# Patient Record
Sex: Female | Born: 1980 | Race: Black or African American | Hispanic: No | Marital: Married | Smoking: Never smoker
Health system: Southern US, Community
[De-identification: ages and names within clinical notes are randomized; demographics above are authoritative.]

## PROBLEM LIST (undated history)

## (undated) DIAGNOSIS — IMO0001 Reserved for inherently not codable concepts without codable children: Secondary | ICD-10-CM

## (undated) DIAGNOSIS — D696 Thrombocytopenia, unspecified: Secondary | ICD-10-CM

## (undated) HISTORY — PX: OTHER SURGICAL HISTORY: SHX169

## (undated) HISTORY — DX: Thrombocytopenia, unspecified: D69.6

---

## 2004-02-15 ENCOUNTER — Inpatient Hospital Stay (HOSPITAL_COMMUNITY): Admission: AD | Admit: 2004-02-15 | Discharge: 2004-02-15 | Payer: Self-pay | Admitting: Family Medicine

## 2004-02-19 ENCOUNTER — Observation Stay (HOSPITAL_COMMUNITY): Admission: AD | Admit: 2004-02-19 | Discharge: 2004-02-20 | Payer: Self-pay | Admitting: Family Medicine

## 2004-02-27 ENCOUNTER — Inpatient Hospital Stay (HOSPITAL_COMMUNITY): Admission: AD | Admit: 2004-02-27 | Discharge: 2004-02-27 | Payer: Self-pay | Admitting: Obstetrics and Gynecology

## 2004-02-28 ENCOUNTER — Encounter (INDEPENDENT_AMBULATORY_CARE_PROVIDER_SITE_OTHER): Payer: Self-pay | Admitting: Specialist

## 2004-02-28 ENCOUNTER — Inpatient Hospital Stay (HOSPITAL_COMMUNITY): Admission: RE | Admit: 2004-02-28 | Discharge: 2004-03-01 | Payer: Self-pay | Admitting: Obstetrics and Gynecology

## 2004-06-28 ENCOUNTER — Other Ambulatory Visit: Admission: RE | Admit: 2004-06-28 | Discharge: 2004-06-28 | Payer: Self-pay | Admitting: Obstetrics and Gynecology

## 2004-12-25 ENCOUNTER — Ambulatory Visit (HOSPITAL_COMMUNITY): Admission: RE | Admit: 2004-12-25 | Discharge: 2004-12-25 | Payer: Self-pay | Admitting: Obstetrics & Gynecology

## 2005-12-08 ENCOUNTER — Ambulatory Visit: Payer: Self-pay | Admitting: Family Medicine

## 2006-04-16 ENCOUNTER — Ambulatory Visit: Payer: Self-pay | Admitting: Family Medicine

## 2006-04-16 LAB — CONVERTED CEMR LAB
ALT: 37 units/L (ref 0–40)
AST: 33 units/L (ref 0–37)
Albumin: 3.8 g/dL (ref 3.5–5.2)
Alkaline Phosphatase: 79 units/L (ref 39–117)
BUN: 16 mg/dL (ref 6–23)
Basophils Absolute: 0.1 10*3/uL (ref 0.0–0.1)
Basophils Relative: 0.9 % (ref 0.0–1.0)
Bilirubin, Direct: 0.1 mg/dL (ref 0.0–0.3)
CO2: 29 meq/L (ref 19–32)
Calcium: 9.1 mg/dL (ref 8.4–10.5)
Chloride: 104 meq/L (ref 96–112)
Creatinine, Ser: 0.8 mg/dL (ref 0.4–1.2)
Eosinophils Absolute: 0.3 10*3/uL (ref 0.0–0.6)
Eosinophils Relative: 4.2 % (ref 0.0–5.0)
GFR calc Af Amer: 112 mL/min
GFR calc non Af Amer: 93 mL/min
Glucose, Bld: 83 mg/dL (ref 70–99)
HCT: 41.7 % (ref 36.0–46.0)
Hemoglobin: 13.7 g/dL (ref 12.0–15.0)
Lymphocytes Relative: 32 % (ref 12.0–46.0)
MCHC: 32.9 g/dL (ref 30.0–36.0)
MCV: 89.5 fL (ref 78.0–100.0)
Monocytes Absolute: 0.7 10*3/uL (ref 0.2–0.7)
Monocytes Relative: 8.6 % (ref 3.0–11.0)
Neutro Abs: 4.1 10*3/uL (ref 1.4–7.7)
Neutrophils Relative %: 54.3 % (ref 43.0–77.0)
Platelets: 195 10*3/uL (ref 150–400)
Potassium: 3.5 meq/L (ref 3.5–5.1)
RBC: 4.66 M/uL (ref 3.87–5.11)
RDW: 12.7 % (ref 11.5–14.6)
Sodium: 140 meq/L (ref 135–145)
TSH: 1.1 microintl units/mL (ref 0.35–5.50)
Total Bilirubin: 0.5 mg/dL (ref 0.3–1.2)
Total Protein: 7.4 g/dL (ref 6.0–8.3)
WBC: 7.6 10*3/uL (ref 4.5–10.5)

## 2006-04-21 ENCOUNTER — Ambulatory Visit: Payer: Self-pay | Admitting: Family Medicine

## 2006-09-14 ENCOUNTER — Inpatient Hospital Stay (HOSPITAL_COMMUNITY): Admission: AD | Admit: 2006-09-14 | Discharge: 2006-09-14 | Payer: Self-pay | Admitting: Obstetrics and Gynecology

## 2007-01-27 ENCOUNTER — Ambulatory Visit: Payer: Self-pay | Admitting: Family Medicine

## 2007-01-27 DIAGNOSIS — J301 Allergic rhinitis due to pollen: Secondary | ICD-10-CM | POA: Insufficient documentation

## 2007-10-04 ENCOUNTER — Inpatient Hospital Stay (HOSPITAL_COMMUNITY): Admission: AD | Admit: 2007-10-04 | Discharge: 2007-10-06 | Payer: Self-pay | Admitting: Obstetrics and Gynecology

## 2007-10-15 ENCOUNTER — Ambulatory Visit: Payer: Self-pay | Admitting: Vascular Surgery

## 2007-10-15 ENCOUNTER — Encounter (INDEPENDENT_AMBULATORY_CARE_PROVIDER_SITE_OTHER): Payer: Self-pay | Admitting: Obstetrics and Gynecology

## 2007-10-15 ENCOUNTER — Ambulatory Visit (HOSPITAL_COMMUNITY): Admission: RE | Admit: 2007-10-15 | Discharge: 2007-10-15 | Payer: Self-pay | Admitting: Obstetrics and Gynecology

## 2008-12-06 ENCOUNTER — Ambulatory Visit: Payer: Self-pay | Admitting: Family Medicine

## 2008-12-06 DIAGNOSIS — J45909 Unspecified asthma, uncomplicated: Secondary | ICD-10-CM | POA: Insufficient documentation

## 2008-12-06 DIAGNOSIS — J309 Allergic rhinitis, unspecified: Secondary | ICD-10-CM | POA: Insufficient documentation

## 2010-01-10 ENCOUNTER — Telehealth: Payer: Self-pay | Admitting: Family Medicine

## 2010-03-26 NOTE — Progress Notes (Signed)
Summary: Pt req antibiotic for abcess in tooth  Phone Note Call from Patient Call back at Home Phone [phone]   Caller: Patient Summary of Call: Pt has an abcess behind a tooth, that goes back to her throat. Pt is req an antibiotic. Pls call in to [facility] on [address]  Initial call taken by: Lucy Antigua,  January 10, 2010 8:21 AM  Follow-up for Phone Call        Fleet Contras please call............ one abscessed tooth.  She needs to see a dentist or if you would like [place] to see her.  We be happy to see her today, but I can call in medicine over the phone Follow-up by: Roderick Pee MD,  January 10, 2010 8:43 AM  Additional Follow-up for Phone Call Additional follow up Details #1::        left message on machine for patient  Additional Follow-up by: Kern Reap CMA Duncan Dull),  January 10, 2010 9:50 AM

## 2010-04-05 ENCOUNTER — Encounter: Payer: Self-pay | Admitting: Family Medicine

## 2010-04-05 ENCOUNTER — Ambulatory Visit (INDEPENDENT_AMBULATORY_CARE_PROVIDER_SITE_OTHER): Payer: Self-pay | Admitting: Family Medicine

## 2010-04-05 DIAGNOSIS — Z049 Encounter for examination and observation for unspecified reason: Secondary | ICD-10-CM

## 2010-04-08 NOTE — Progress Notes (Signed)
  Subjective:    Patient ID: Jasmine [name], female    DOB: 09/19/1980, 30 y.o.   MRN: [id number]  HPI    Review of Systems     Objective:   Physical Exam        Assessment & Plan:  No show

## 2010-07-09 NOTE — H&P (Signed)
Jasmine [name], BENTON                ACCOUNT NO.:  [id number]   MEDICAL RECORD NO.:  [id number]          PATIENT TYPE:  INP   LOCATION:  9115                          [facility]   PHYSICIAN:  Lenoard Aden, M.D.DATE OF BIRTH:  April 03, 1980   DATE OF ADMISSION:  10/04/2007  DATE OF DISCHARGE:                              HISTORY & PHYSICAL   CHIEF COMPLAINT:  Spontaneous rupture of membranes.   She is a 61 African American female at 37 plus weeks with spontaneous  rupture of membranes at approximately 4 a.m. this morning.  She has no  known drug allergies.  Medications are prenatal vitamins.  She has a  history of fibroids uncomplicated during this pregnancy.  She is a  nonsmoker, nondrinker.  Denies domestic or physical violence.  History  of one previous ectopic pregnancy with salpingectomy and history of one  therapeutic abortion, family history of hypertension, stroke, heart  disease, and breast cancer.   PHYSICAL EXAMINATION:  GENERAL:  She is a well-developed, well-nourished  [place] American female in no acute distress.  HEENT:  Normal.  LUNGS:  Clear.  HEART:  Regular rhythm.  ABDOMEN:  Soft, gravid, nontender.  Estimated fetal weight 79 pounds.  Cervix is 2-3, 100%, vertex -1.  EXTREMITIES:  No cords.  NEUROLOGIC:  Nonfocal.  SKIN:  Intact.   IMPRESSION:  Term intrauterine pregnancy with spontaneous rupture of  membranes.   PLAN:  Pitocin augmentation, epidural p.r.n., anticipated attempts at  vaginal delivery.      Lenoard Aden, M.D.  Electronically Signed     RJT/MEDQ  D:  10/04/2007  T:  10/05/2007  Job:  [id number]

## 2010-07-12 NOTE — H&P (Signed)
NAMESHARNAE, WINFREE              ACCOUNT NO.:  [id number]   MEDICAL RECORD NO.:  [id number]          PATIENT TYPE:  AMB   LOCATION:  SDC                           [facility]   PHYSICIAN:  James A. Ashley Royalty, M.D.DATE OF BIRTH:  08/12/1980   DATE OF ADMISSION:  02/28/2004  DATE OF DISCHARGE:                                HISTORY & PHYSICAL   Please have this dictation available on the outpatient surgical area at  [facility], February 28, 2004, at 12:45 p.m.   This is a 30 year old primigravida, who first presented on December 22 as an  unassigned patient, complaining of pelvic pain, vaginal spotting, and  positive pregnancy test noted at [facility].  She was evaluated by the  physician accepting unassigned patients on December 22, and the impression  at that time was a possible early pregnancy with threatened abortion versus  ectopic pregnancy.  Initial hCG was 391 MIU/ml.  Ultrasound was obtained  December 22 and revealed no obvious intrauterine pregnancy.  There were  three masses posterior to the uterus noted.  The etiology of the masses was  somewhat unclear.  Two masses were felt to possibly represent complex cysts.  Another potentially was felt to represent a possible ectopic pregnancy,  though no definitive diagnosis could be made.  At the time of that  encounter, the patient's symptoms were minimal and considering the  uncertainty regarding her last normal menstrual period, the decision was  made to follow her expectantly.  She returned for follow up, I believe, on  December 24, at which time she stated her pain was minimal.  She continued  to deny any postural symptoms.  Repeat hCG was obtained, and the value had  dropped to 317 MIU/mL.   I saw the patient in the office follow up on or about February 23, 2004.  She continued to suggest improving clinical status with almost no pain being  evident.  She had been previously given a prescription for Darvocet which  she had not required at all.  Instead, she was using some ibuprofen for  minimal discomfort.  On December 30, hCG was ordered, and the value was 164  MIU/mL.   The patient returned February 27, 2004, for follow up.  She stated that since  February 25, 2004, her pelvic pain has increased and is beginning to be  debilitating.  She continues to deny any postural symptoms.  She does  continue to have vaginal spotting, but it is less than that of a normal  menstrual period.  She denies any passage of tissue.   MEDICATIONS:  Ibuprofen.   PAST MEDICAL HISTORY:   MEDICAL:  Negative.   SURGICAL:  Negative.   ALLERGIES:  None.   FAMILY HISTORY:  Noncontributory.   SOCIAL HISTORY:  The patient denies use of tobacco or significant alcohol.   REVIEW OF SYSTEMS:  Noncontributory.   PHYSICAL EXAMINATION:  GENERAL:  Well-developed, well-nourished, pleasant,  black female in no acute distress.  VITAL SIGNS:  Afebrile, vital signs stable.  SKIN:  Warm and dry without lesions.  LYMPH NODES:  No supraclavicular,  cervical, or inguinal adenopathy.  HEENT:  Normocephalic.  NECK:  Without thyromegaly.  CHEST/LUNGS:  Clear.  CARDIAC:  Regular rate and rhythm without murmurs, gallops, or rubs.  BREAST EXAM:  Deferred.  ABDOMEN:  Soft and minimally tender to direct palpation in the lower  quadrants.  I can appreciate no rebound tenderness.  I can appreciate no  obvious mass or organomegaly.  Bowel sounds are active.  MUSCULOSKELETAL:  No CVA tenderness.  PELVIC:  External genitalia within normal limits.  Vagina and cervix are  without gross lesions.  There is a minimal amount of old blood in the vault.  Bimanual examination reveals the uterus to be approximately 8-9 x 5 x 4 cm,  and no adnexal masses are palpable.  There is some cervical motion  tenderness present.   Ultrasound today at [facility] revealed no significant change from the  previous ultrasound of on or about February 15, 2004.    IMPRESSION:  1.  Pregnancy-related event - possible spontaneous abortion versus ectopic      versus tubal abortion.  2.  Retrouterine cysts/masses on ultrasound.  Two seemed to represent      visualizing cysts, and one could potentially represent an ectopic.  3.  Increasing abdominal discomfort for the first time since presentation,      February 15, 2004.   PLAN:  Due to the increasing pelvic discomfort, we will proceed with  diagnostic/operative laparoscopy.  Risks, benefits, complications, and  alternatives were fully discussed with the patient.  She understands the  possibility of linear salpingostomy and agrees to same.  She also  understands the distinct possibility of the need for a unilateral salpingo-  oophorectomy and agrees to same.  She also agrees to any indicated  contralateral cystectomies should ovarian cysts be encountered.  Finally,  she understands the possibility of exploratory laparotomy and agrees to  same.  Questions invited and answered.      JAM/MEDQ  D:  02/27/2004  T:  02/27/2004  Job:  [id number]

## 2010-07-12 NOTE — Consult Note (Signed)
[name], [name]              ACCOUNT NO.:  [id number]   MEDICAL RECORD NO.:  [id number]          PATIENT TYPE:  MAT   LOCATION:  MATC                          [facility]   PHYSICIAN:  Charles A. Delcambre, MDDATE OF BIRTH:  08-18-1980   DATE OF CONSULTATION:  02/15/2004  DATE OF DISCHARGE:                                   CONSULTATION   CHIEF COMPLAINT:  Pelvic pain, vaginal spotting, positive pregnancy test at  [facility].   A 30 year old para 0-0-0-0, LMP first week of December, January 29, 2004,  unclear as to estimated gestational age with some pills missed the first one  or two weeks of November, putting her unclear as to estimated gestational  age.  She did some urine pregnancy tests at the end of November and had a  period that was a week early, but her pills were not clearly taken at that  time.  Her period was a week early on January 29, 2004.  She thereafter took  her pills regularly but now started spotting several days ago and developed  cramping pain one to two days ago.  She took some Motrin yesterday and had  pain resolved and then slept last night.  The pain recurred this afternoon,  cramping-like and central in the pelvis, and she presented this afternoon.  She had no severe pain or abdominal pain, just the cramping-like pain and  spotting.   PAST MEDICAL HISTORY:  None.   PAST SURGICAL HISTORY:  None.   MEDICATIONS:  None.   ALLERGIES:  No known drug allergies.   SOCIAL HISTORY:  No tobacco, ethanol, or drug use.  She stopped taking birth  control pills two days ago.  Occasional alcohol use.  Not currently married.  She is sexually active.  Condoms are not used.   FAMILY HISTORY:  Unrelated.   PHYSICAL EXAMINATION:  VITAL SIGNS:  Temperature 97.2, pulse 69,  respirations 20, blood pressure 115/69.  HEENT:  Grossly within normal limits.  NECK:  Supple without thyromegaly or adenopathy.  CHEST:  Lungs clear bilaterally.  BREASTS:  Symmetrical  and otherwise deferred.  CARDIAC:  Regular rate and rhythm.  ABDOMEN:  Soft upper quadrants, nondistended and nontender.  Lower  quadrants:  Very minimal tenderness to deep palpation.  No guarding,  rebound, or peritoneal signs.  Flanks nontender.  PELVIC:  No cervical motion tenderness.  The posterior cul-de-sac is  moderately tender.  Palpating the posterior cul-de-sac, the adnexal regions  mildly tender bilaterally.  Uterus midplane.  Referred tenderness to the  posterior cul-de-sac, not really laterally to the adnexa, mainly central  pain.  A small amount of dark blood present in the vault.  EXTREMITIES:  No edema of significance.   LABORATORY DATA:  Quantitative HCG 391.  Rh positive.  CBC:  Platelets 169,  white count 7.7, hemoglobin 12.6, hematocrit 38.0.  Ultrasound read as:  Three masses posterior to the uterus, questionable etiology.  There was  question in that no uterine pregnancy was seen, could this represent  ruptured ovarian cysts, complex cysts versus a ruptured ectopic pregnancy?  A small amount  of free fluid was seen.   ASSESSMENT:  Early pregnancy with quantitative human chorionic gonadotropin  of 391 versus a threatened abortion with a low quantitative human chorionic  gonadotropin versus ectopic pregnancy with low human chorionic gonadotropin.  Unclear at this point, needing further delineation with a second human  chorionic gonadotropin.  Appearing stable at this point with hematocrit and  hemoglobin stable.  The patient does have significant pain.   She appears to be a good, reliable patient and at this time options would be  to manage with expectant management, very close follow-up, repeat HCG and  CBC serially versus going for exploration in the operating room tonight  versus proceed with methotrexate or D&C.  Neither of [place] feel that it is  indicated at this point to go along with a D&C.  We would both prefer, as  per my advice, to wait on HCG again in two to  three days to see if this will  double or go down for further help; however, should she have increased pain  or become weak or dizzy or anything of this nature with precautions, she is  to return immediately to the emergency room for further evaluation.  In this  manner we will go ahead and expectantly manage with strict ectopic  precautions.  We did discuss going to the operating room tonight for  evaluation and, as she is stable at this time with a low HCG and stable H&H,  we will forego immediate operative intervention at this time pending further  HCG studies.  Questions are answered and she was in agreement.  She gives  informed consent.  She will be likely traveling.  I have cautioned her in  this regard.  She will go ahead and likely travel in the morning down to  near [place].  She will get where she can always be near a hospital to  go to if she does, indeed, go out of town.  She is to return here Monday to  get HCG and CBC rechecked at that time.  She was in agreement and will  follow up as directed.      CAD/MEDQ  D:  02/15/2004  T:  02/16/2004  Job:  [id number]

## 2010-07-12 NOTE — Discharge Summary (Signed)
NAMECARLINE, Jasmine [name]              ACCOUNT NO.:  [id number]   MEDICAL RECORD NO.:  [id number]          PATIENT TYPE:  OBV   LOCATION:  9320                          [facility]   PHYSICIAN:  James A. Ashley Royalty, M.D.DATE OF BIRTH:  Apr 25, 1980   DATE OF ADMISSION:  02/19/2004  DATE OF DISCHARGE:  02/20/2004                                 DISCHARGE SUMMARY   DISCHARGE DIAGNOSES:  1.  Several small masses of undetermined etiology behind the uterus on      ultrasound -- differential includes hemorrhagic corpus luteum, other      physiologic cyst, ectopic pregnancy.  2.  Pelvic pain, vaginal bleeding, and positive quantitative beta hCG.      Differential includes ectopic versus an inevitable abortion, possible      tubal abortion.  3.  Diminishing quantitative beta hCG over three determinations --      consistent with inevitable abortion, tubal abortion, possible ectopic.   OPERATION/SPECIAL PROCEDURES:  None.   DISCHARGE MEDICATIONS:  1.  Motrin 600 mg q.i.d. p.r.n.  2.  Darvocet N-100 mg one p.o. q.3-4 h., p.r.n.   HISTORY AND PHYSICAL:  This is a 30 year old, primigravida who presented on  February 05, 2004 to the [facility]missions [facility] as an unassigned patient  and was evaluated by Bing Neighbors. Delcambre, MD.  Ultrasound was obtained and  revealed no obvious intrauterine pregnancy although there were some dating  issues that would make a pregnancy as early as three or so weeks a  possibility.  There were two or three small masses posterior to the uterus.  The etiology of the masses was unclear.  Two were felt to possibly represent  complex cysts such as hemorrhagic corpus luteum or other physiologic cysts.  Another possibility was an ectopic but no definite diagnosis could be made.  The patient returned on the day of admission for follow up.  She stated an  improvement in her symptomatology.  She denies any postural symptoms.  Examination revealed the vagina and cervix to be  without gross lesions.  There was a small amount of blood in the vault.  The cervical os was closed.  Beta hCG on the day of admission was 317 (decreased from 391 on February 15, 2004).  In view of the patient's diminishing symptoms, absence of syncopal  episodes, and declining hCG, the decision was made to admit the patient for  23 hours' observation in lieu of surgical intervention.   HOSPITAL COURSE:  The patient was admitted to [facility].  Initial laboratory studies were drawn.  She continued to be  having only minimal abdominal/pelvic discomfort, left greater than right.  She continued to deny any syncopal or postural symptoms.  Beta hCG was  repeated on February 20, 2004 and the value obtained was 266.  Hemoglobin in  the morning of December 27, was 10.9.  On the afternoon of December 27, it  was back to 11.8.  At this point, the patient was felt to be stable for  discharge and she appears to be a reliable patient and  lives in [place].  I stressed to her the importance of not leaving town  until this issue is resolved.  We discussed ectopic precautions at length.  She knows to call us should she develop increasing pain, postural symptoms  or other deterioration in her clinical status.  Otherwise, she is to return  to my office in two days for follow up.      JAM/MEDQ  D:  02/20/2004  T:  02/20/2004  Job:  [id number]   cc:   Leonette Most A. Sydnee Cabal, MD  Fax: [phone]

## 2010-07-12 NOTE — Discharge Summary (Signed)
Jasmine [name], Jasmine [name]              ACCOUNT NO.:  [id number]   MEDICAL RECORD NO.:  [id number]          PATIENT TYPE:  INP   LOCATION:  9305                          [facility]   PHYSICIAN:  James A. Ashley Royalty, M.D.DATE OF BIRTH:  1980/06/03   DATE OF ADMISSION:  02/28/2004  DATE OF DISCHARGE:  03/01/2004                                 DISCHARGE SUMMARY   DISCHARGE DIAGNOSES:  1.  Left ampullary tubal ectopic pregnancy.  2.  Right corpus luteum cyst.   OPERATION:  1.  Diagnostic laparoscopy.  2.  Exploratory laparotomy.  3.  Left salpingectomy.   CONSULTATIONS:  None.   DISCHARGE MEDICATIONS:  Percocet.   HISTORY AND PHYSICAL:  This is 30 year old primigravida who first presented  February 15, 2004 as unassigned patient.  She was evaluated Dr. Sydnee Cabal,  who was assigned to see the unassigned patient on that particular day.  Initial HCG was 391.  Ultrasound obtained that day revealed no obvious  intrauterine pregnancy, and three masses posterior to the uterus.  Decision  was made to follow the patient as an outpatient.  I later evaluated the  patient in the emergency department and noted her HCG falling, symptoms  minimal and hemodynamically stable.  She was allowed to undergo expectant  management, and I saw her back in the office on or about February 23, 2004.  She continued to suggest an improving clinical status, with almost no pain  whatsoever.  The HCG on February 23, 2004 had diminished down to a level of  164 MIU/mL.   The patient returned on February 27, 2004 for a followup.  She stated at that  point her pain was beginning to increase and was beginning to get  debilitating.  Hence, she was scheduled for diagnostic laparoscopy, and  indicative procedures.  For the remaining history and physical please see  chart.   HOSPITAL COURSE:  The patient was brought to [facility] on February 28, 2004 for diagnostic laparoscopy.  The procedure was performed and a  left  ampullary tubal ectopic noted.  The procedure could not be completed  laparoscopy, and hence the patient underwent exploratory laparotomy with  left salpingectomy.  The procedure was uncomplicated.   Her postoperative course was similarly uncomplicated, and she was allowed to  be discharged home on March 01, 2004.  She was afebrile and in satisfactory  condition.   Hemoglobin and hematocrit on admission were 10.1 and 29.9 respectively.  White blood count was 11,700.   DISPOSITION:  The patient is to return to [facility]  on March 04, 2004, to have the remainder of her clips removed and for  postoperative evaluation.      JAM/MEDQ  D:  04/02/2004  T:  04/02/2004  Job:  [id number]

## 2010-07-12 NOTE — H&P (Signed)
Jasmine [name], Jasmine [name]              ACCOUNT NO.:  [id number]   MEDICAL RECORD NO.:  [id number]          PATIENT TYPE:  MAT   LOCATION:  [facility]                          FACILITY:  [facility]   PHYSICIAN:  James A. Ashley Royalty, M.D.DATE OF BIRTH:  Jul 09, 1980   DATE OF ADMISSION:  02/19/2004  DATE OF DISCHARGE:                                HISTORY & PHYSICAL   This is a 30 year old prima gravida who first presented as an unassigned  patient to Dr. Sydnee Cabal on February 15, 2004.  Her chief complaint at that  time was pelvic pain, vaginal spotting, and positive pregnancy test at [facility].  She was evaluated on December 22 by Dr. Sydnee Cabal and reported her  last menstrual period to be approximately December 5.  She admitted to  missing some Modicon oral contraceptives during the cycle prior to that  period.  She did a urine pregnancy test at the end of November and it was  negative.  The December 5 period seemed normal to her although it was a week  early.  She took her pills until December 20 at which time she stopped.  She  states she had what appeared to be a normal period on December 5, then  stopped for a week, and resumed having spotting at least on a daily basis  beginning December 12.  She categorically denies any passage of tissue or  any clots.  She denies having any STD history.  She denies any syncope or  dizziness.  She states over the last several days her discomfort has gotten  much less sharp.  She returned today to the maternity admissions on the  advice of Dr. Sydnee Cabal for appropriate followup.   Ultrasound was obtained on December 22 and revealed no obvious intrauterine  pregnancy.  There were three masses posterior to the uterus.  The etiology  of the masses was somewhat unclear.  Two masses were felt to possibly  represent complex cyst.  Another could potentially be an ectopic but no  definitive diagnosis could be made.  Quantitative beta hCG on that day  returned 391  milli-international units per mL.  Hemoglobin was 12.6.   The patient returned today for followup.  She states her left lower quadrant  pain is dramatically less sharp.  She continues to deny any dizziness or  lightheadedness.  She continues to deny any tissue passage or clots.  She  would ultimately like to retain her childbearing capability.   MEDICATIONS:  None.   PAST MEDICAL HISTORY:  Medical are negative.  Surgical: Negative.   ALLERGIES:  None.   FAMILY HISTORY:  Noncontributory.   SOCIAL HISTORY:  The patient denies use of tobacco or significant alcohol.  She is single.  She is sexually active.   PHYSICAL EXAMINATION:  GENERAL: Well-developed, well-nourished, pleasant  black female in no acute distress.  The patient is an excellent historian.  VITAL SIGNS:  Afebrile, vital signs are stable.  SKIN:  Warm and dry without lesions.  LYMPH: No supraclavicular, cervical, or inguinal adenopathy.  HEENT: Normocephalic.  NECK:  Supple without thyromegaly.  CHEST:  Lungs are clear.  CARDIAC:  Regular rate and rhythm without murmurs, gallops, rubs.  BREAST EXAM: Deferred.  ABDOMEN:  Soft and nondistended.  There is some voluntary guarding; however,  I can appreciate no involuntary guarding.  There is minimal tenderness to  deep palpation.  I can appreciate no peritoneal signs.  Bowel sounds are  active.  MUSCULOSKELETAL:  No CVA tenderness.  PELVIC:  External genitalia are within normal limits.  Vagina and cervix are  without gross lesions.  There is a small amount of blood in the vault.  The  cervical os is closed.  Bimanual examination is suboptimal due to voluntary  guarding; however, I can appreciate no uterine enlargement.  I can  appreciate no obvious adnexal masses or pelvic masses.   LABORATORY DATA:  Hemoglobin today is 12.0.  White blood count is 7300.  Quantitative beta hCG is 317 milli-international units/mL (decreased from  391).  Type and Rh reveals O positive  blood.   Review of the ultrasound dated December 22 reveals several pelvic masses of  undetermined etiology.  The larger was favored as a hemorrhagic ovarian cyst  and possible corpus luteum cyst.  The larger complex, solid-appearing mass  in the left posterior pelvis was undetermined etiology.  There was a small  oval structure within the mass measuring 2.4 cm which was not felt to have a  typical appearance of a gestational sac.  Of course, it was not possible to  rule out an ectopic.   IMPRESSION:  1.  Several small pelvic masses of undetermined etiology.  Differential      includes hemorrhagic corpus luteum cyst, other physiologic cyst, ectopic      pregnancy.  2.  Pelvic pain, vaginal bleeding, and positive quantitative beta hCG-      differential includes ectopic versus inevitable abortion.  Possible      tubal abortion.   PLAN:  In view of the patient diminishing symptoms of pain, absence of  syncopal symptoms and declining hCG, we will elect to admit for observation  in lieu of surgical intervention at this time.  GC and chlamydia culture was  performed on December 22 and the results will be obtained.  We will repeat  quantitative beta hCG in a.m.  We will follow closely.  The patient states  she understands and accepts and is comfortable with this plan.  Questions  were invited and answered.      JAM/MEDQ  D:  02/19/2004  T:  02/19/2004  Job:  [id number]   cc:   Leonette Most A. Sydnee Cabal, MD  Fax: [phone]

## 2010-07-12 NOTE — Op Note (Signed)
Jasmine [name], Jasmine [name]              ACCOUNT NO.:  [id number]   MEDICAL RECORD NO.:  [id number]          PATIENT TYPE:  AMB   LOCATION:  [facility]                           [facility]ACILITY:  [facility]   PHYSICIAN:  James A. Ashley Royalty, M.D.DATE OF BIRTH:  [birth year]   DATE OF PROCEDURE:  02/28/2004  DATE OF DISCHARGE:                                 OPERATIVE REPORT   PREOPERATIVE DIAGNOSES:  1.  Retrouterine masses on ultrasound.  2.  Positive HCG.  3.  Pelvic pain.   DIFFERENTIAL:  Ectopic pregnancy, possible ovarian cyst or cysts.   POSTOPERATIVE DIAGNOSES:  1.  Left ampullary tubal ectopic.  2.  Right corpus luteum cyst.   PROCEDURE:  1.  Diagnostic laparoscopy.  2.  Exploratory laparotomy.  3.  Left salpingectomy.   SURGEON:  Rudy Jew. Ashley Royalty, M.D.   ANESTHESIA:  General.   ESTIMATED BLOOD LOSS:  Less than 100 cc.   COMPLICATIONS:  None.   PACKS AND DRAINS:  Foley.  Sponge, needle and instrument count reported as  correct at the end of procedure.   DESCRIPTION OF PROCEDURE:  The patient was taken to the operating room and  placed in the dorsal supine position.  After general anesthesia was  administered, she was placed in the lithotomy position and prepped and  draped in usual manner for abdominal and vaginal surgery.  Posterior  weighted retractor was placed per vagina.  The anterior lip of the cervix  was grasped with a single-toothed tenaculum.  Jarcho uterine manipulator was  placed per cervix and the manipulator held in place with a tenaculum.  The  bladder was drained with a Foley catheter, which was left in the bladder  throughout the procedure.   Next, a 1.5 cm infraumbilical incision was made in the longitudinal plane.  A Veress needle was inserted into the abdominal cavity.  It was location was  verified by instillation of saline and __________ drop technique.  An  attempt was made to create a pneumoperitoneum; however, for reasons that  were unclear, it was not possible  to create a flow of CO2.  Hence, the size  10-11 disposable trocar was placed into the abdominal cavity directly; the  location was verified with the laparoscope.  There was no evidence of any  trauma.  A pneumoperitoneum was created with CO2 and maintained throughout  the laparoscopy.   Next, two 5 mm suprapubic trocars were placed in the left and right lower  quadrants respectively.  Transillumination and direct visualization  techniques were employed.  The pelvis was visualized.  Immediately upon  visualizing the pelvis, a hemoperitoneum was noted.  The __________ suction  irrigator was used to evacuate as much blood and debris as possible.  As the  pelvis came into better view, the uterus was noted to be normal size, shape  and contour.  Posterior to the uterus was a collection of masses, which were  adherent to one another, to what appeared to be a fibrinous material.  It  was not possible to release the masses from each other laparoscopically;  however, it appeared that there was  a left ampullary ectopic present.   At this point, the decision was made to cease further attempts to accomplish  the procedure laparoscopically, as the adherence between the masses was too  great and the operator felt it could not be done safely.  Hence, the  laparoscopic instruments were removed and the pneumoperitoneum evacuated.  The superior vaginal defect was closed with 0 Vicryl in an interrupted  fashion.  The skin was closed with 3-0 Monocryl in a subcuticular fashion.   Attention was then turned to the laparotomy.  A Pfannenstiel skin incision  was made, which incorporated the previous skin incisions from the 5 mm  suprapubic trocars.  The subcutaneous tissues were sharply dissected down to  the fascia, which was nicked with the knife and incised transversely with  Mayo scissors.  The underlying rectus muscles were separated from the fascia  using sharp and blunt dissection.  The rectus muscles  were separated in the  midline, exposing the peritoneum, which was elevated with hemostats and  entered atraumatically with Metzenbaum  scissors.  The incision was extended  longitudinally.  O'Connor-O'Sullivan retractor was placed into the field.  The field was suctioned.  The upper abdomen was packed off with two  laparotomy sponges.  At this point the pelvis could be much better  visualized.  The operator reached behind the uterus and was able to bluntly  dissect the adherent structures free from one another.  The left tube  contained an apparent ruptured ampullary ectopic.  The left ovary appeared  normal size and shape, but was covered with a fibrinous exudate.  The right  ovary contained approximately 2 cm apparent corpus luteum cyst; it too was  covered with a fibrinous exudate as well.  However, the adnexa appeared to  be basically normal, except for the fact that there was evidence of old  bleeding with the subsequent fibrinous exudate and adhesive process.  The  right fallopian tube appeared normal size, shape, contour and length upon  __________ the fimbria.  The remainder of the peritoneal surfaces appeared  normal.   At this point, the decision was made to proceed with left salpingectomy.  The left mesosalpinx was serially clamped with Kelly's and the left  fallopian tube excised from the uterine cornua.  The pedicles on the  mesosalpinx were secured with 2-0 Vicryl in an interrupted fashion.  The  area at the cornua was secured with a single 2-0 Vicryl suture in an  interrupted fashion.  Copious irrigation was accomplished.  Hemostasis was  noted.   At this point, the patient was felt to have benefitted maximally from the  surgical procedure.  The sponges were removed and O'Connor-O'Sullivan  retractor removed.  The pelvic contents were inspected once again, and found  to be hemostatic.  The peritoneum was then closed with 3-0 Vicryl in a running fashion.  The fascia was  closed with O Vicryl in a running fashion.  The subcutaneous tissues were closed with 2-0 plain cat gut in interrupted  fashion.  The skin was closed with staples.   The patient tolerated the procedure extremely well, and was returned to the  recovery room in good condition.      JAM/MEDQ  D:  02/28/2004  T:  02/28/2004  Job:  [id number]

## 2010-11-22 LAB — CBC
HCT: 32.8 — ABNORMAL LOW
HCT: 37.4
HCT: 37.4
Hemoglobin: 10.9 — ABNORMAL LOW
Hemoglobin: 12.3
Hemoglobin: 12.4
MCHC: 32.9
MCHC: 33.3
MCHC: 33.3
MCV: 93.7
MCV: 95.2
MCV: 95.2
Platelets: 70 — ABNORMAL LOW
Platelets: 75 — ABNORMAL LOW
Platelets: 78 — ABNORMAL LOW
RBC: 3.44 — ABNORMAL LOW
RBC: 3.93
RBC: 3.99
RDW: 13.3
RDW: 13.6
RDW: 13.8
WBC: 10.3
WBC: 11.3 — ABNORMAL HIGH
WBC: 18.2 — ABNORMAL HIGH

## 2010-11-22 LAB — COMPREHENSIVE METABOLIC PANEL
ALT: 14
AST: 18
Albumin: 2.9 — ABNORMAL LOW
Alkaline Phosphatase: 176 — ABNORMAL HIGH
BUN: 6
CO2: 23
Calcium: 8.8
Chloride: 107
Creatinine, Ser: 0.59
GFR calc Af Amer: >60
GFR calc non Af Amer: >60
Glucose, Bld: 86
Potassium: 3.6
Sodium: 134 — ABNORMAL LOW
Total Bilirubin: 0.5
Total Protein: 6.1

## 2010-11-22 LAB — URIC ACID: Uric Acid, Serum: 3.1

## 2010-11-22 LAB — LACTATE DEHYDROGENASE: LDH: 109

## 2010-11-22 LAB — RPR: RPR Ser Ql: NONREACTIVE

## 2010-12-09 LAB — WET PREP, GENITAL
Clue Cells Wet Prep HPF POC: NONE SEEN
Trich, Wet Prep: NONE SEEN
Yeast Wet Prep HPF POC: NONE SEEN

## 2010-12-09 LAB — CBC
HCT: 40.1
Hemoglobin: 13.4
MCHC: 33.4
MCV: 88.7
Platelets: 176
RBC: 4.52
RDW: 13.8
WBC: 9.2

## 2010-12-09 LAB — URINALYSIS, ROUTINE W REFLEX MICROSCOPIC
Bilirubin Urine: NEGATIVE
Glucose, UA: NEGATIVE
Hgb urine dipstick: NEGATIVE
Ketones, ur: NEGATIVE
Nitrite: NEGATIVE
Protein, ur: NEGATIVE
Specific Gravity, Urine: >1.03 — ABNORMAL HIGH
Urobilinogen, UA: 0.2
pH: 5.5

## 2010-12-09 LAB — POCT PREGNANCY, URINE
Operator id: 251141
Preg Test, Ur: NEGATIVE

## 2010-12-09 LAB — GC/CHLAMYDIA PROBE AMP, GENITAL
Chlamydia, DNA Probe: NEGATIVE
GC Probe Amp, Genital: NEGATIVE

## 2011-04-07 ENCOUNTER — Ambulatory Visit (INDEPENDENT_AMBULATORY_CARE_PROVIDER_SITE_OTHER): Payer: BC Managed Care – PPO | Admitting: Family Medicine

## 2011-04-07 ENCOUNTER — Ambulatory Visit: Payer: Self-pay | Admitting: Family Medicine

## 2011-04-07 ENCOUNTER — Encounter: Payer: Self-pay | Admitting: Family Medicine

## 2011-04-07 VITALS — BP 110/70 | Temp 98.7°F | Ht 66.0 in | Wt 190.0 lb

## 2011-04-07 DIAGNOSIS — J45909 Unspecified asthma, uncomplicated: Secondary | ICD-10-CM

## 2011-04-07 MED ORDER — PREDNISONE 20 MG PO TABS: ORAL_TABLET | ORAL | Status: DC

## 2011-04-07 MED ORDER — HYDROCODONE-HOMATROPINE 5-1.5 MG/5ML PO SYRP: ORAL_SOLUTION | ORAL | Status: DC

## 2011-04-07 NOTE — Patient Instructions (Signed)
Drink lots of water  Run a vaporizer  in your bedroom at night  Take the prednisone as directed  Hydromet 1/2-1 teaspoon at bedtime for nighttime cough return when necessary

## 2011-04-07 NOTE — Progress Notes (Signed)
  Subjective:    Patient ID: Jasmine [name], female    DOB: [birth year], 31 y.o.   MRN: [id number]  HPI Jasmine [name] is a 31 year old female nonsmoker who comes in today with a three-week history of wheezing  She states she was up [place] 3 weeks ago a family funeral and developed a cold. The cold went away but it triggered her asthma. We last saw her about 2-3 years ago with a flare of her asthma. Since that time she's used an occasional puff of albuterol when necessary  No fever    Review of Systems General and pulmonary review of systems otherwise negative    Objective:   Physical Exam Well-developed well-nourished female in no acute distress HEENT negative neck was supple no adenopathy lungs are clear except for mild symmetrical late expiratory wheezing       Assessment & Plan:  Viral syndrome with secondary asthma plan prednisone burst and taper return when necessary

## 2011-04-11 ENCOUNTER — Encounter: Payer: Self-pay | Admitting: Family

## 2011-04-11 ENCOUNTER — Ambulatory Visit (INDEPENDENT_AMBULATORY_CARE_PROVIDER_SITE_OTHER): Payer: BC Managed Care – PPO | Admitting: Family

## 2011-04-11 VITALS — BP 120/80 | HR 81 | Wt 191.0 lb

## 2011-04-11 DIAGNOSIS — R059 Cough, unspecified: Secondary | ICD-10-CM

## 2011-04-11 DIAGNOSIS — J209 Acute bronchitis, unspecified: Secondary | ICD-10-CM

## 2011-04-11 DIAGNOSIS — R05 Cough: Secondary | ICD-10-CM

## 2011-04-11 MED ORDER — AMOXICILLIN-POT CLAVULANATE 875-125 MG PO TABS: 1.0000 | ORAL_TABLET | Freq: Two times a day (BID) | ORAL | Status: AC

## 2011-04-11 MED ORDER — BECLOMETHASONE DIPROPIONATE 80 MCG/ACT IN AERS: 1.0000 | INHALATION_SPRAY | Freq: Two times a day (BID) | RESPIRATORY_TRACT | Status: DC

## 2011-04-11 NOTE — Progress Notes (Signed)
  Subjective:    Patient ID: Jasmine [name], female    DOB: Jan 09, 1981, 31 y.o.   MRN: [id number]  HPI Comments: C/o sinus drainage, intermittent shortness of breath worse at night, and nonproductive cough x 3.5 weeks getting progressively worse unrelieved with prescribed prednisone and hycodan syrup. Took son's albuterol neb treatment with some relief. Denies fever or chills.      Review of Systems  Constitutional: Negative.   HENT: Positive for congestion, postnasal drip and sinus pressure. Negative for ear pain, nosebleeds, rhinorrhea, sneezing, trouble swallowing, neck pain, neck stiffness and tinnitus.   Respiratory: Positive for cough, shortness of breath and wheezing. Negative for apnea, choking, chest tightness and stridor.   Cardiovascular: Negative.    Past Medical History  Diagnosis Date  . Allergic rhinitis   . Asthma     History   Social History  . Marital Status: Married    Spouse Name: N/A    Number of Children: N/A  . Years of Education: N/A   Occupational History  . Not on file.   Social History Main Topics  . Smoking status: Never Smoker   . Smokeless tobacco: Not on file  . Alcohol Use: No  . Drug Use: No  . Sexually Active:    Other Topics Concern  . Not on file   Social History Narrative  . No narrative on file    No past surgical history on file.  No family history on file.  No Known Allergies  Current Outpatient Prescriptions on File Prior to Visit  Medication Sig Dispense Refill  . HYDROcodone-homatropine (HYCODAN) 5-1.5 MG/5ML syrup 1/2-1 teaspoon at bedtime when necessary for cough  240 mL  0  . predniSONE (DELTASONE) 20 MG tablet 2 tabs now then 2 tabs x3 days, 1 tab x3 days, a half a tab x3 days, then half a tablet Monday Wednesday Friday for a two-week taper  30 tablet  1  . albuterol (PROAIR HFA) 108 (90 BASE) MCG/ACT inhaler Inhale 2 puffs into the lungs 3 (three) times daily as needed. 1-2 puffs three times a day as needed          BP 120/80  Pulse 81  Wt 191 lb (86.637 kg)  SpO2 98%chart    Objective:   Physical Exam  Constitutional: She is oriented to person, place, and time. She appears well-developed and well-nourished. No distress.  HENT:  Right Ear: External ear normal.  Left Ear: External ear normal.  Nose: Nose normal.  Mouth/Throat: Oropharynx is clear and moist.  Cardiovascular: Normal rate, regular rhythm, normal heart sounds and intact distal pulses.  Exam reveals no gallop.   No murmur heard. Pulmonary/Chest: Effort normal and breath sounds normal. No respiratory distress. She has no wheezes. She has no rales. She exhibits no tenderness.  Neurological: She is alert and oriented to person, place, and time.  Skin: Skin is warm. She is not diaphoretic.  Psychiatric: She has a normal mood and affect.          Assessment & Plan:  Assessment: Sinusitis, asthma Plan: increase po fluids, rest, teaching handout provided on asthma and sinusitis, Qvar inhaler one puff twice a day, augmentin, albulterol inhaler prn

## 2011-04-11 NOTE — Patient Instructions (Signed)

## 2011-11-20 ENCOUNTER — Encounter: Payer: Self-pay | Admitting: Family Medicine

## 2011-11-20 ENCOUNTER — Ambulatory Visit (INDEPENDENT_AMBULATORY_CARE_PROVIDER_SITE_OTHER): Payer: BC Managed Care – PPO | Admitting: Family Medicine

## 2011-11-20 VITALS — BP 110/70 | Temp 99.0°F | Wt 183.0 lb

## 2011-11-20 DIAGNOSIS — N6001 Solitary cyst of right breast: Secondary | ICD-10-CM

## 2011-11-20 DIAGNOSIS — N6009 Solitary cyst of unspecified breast: Secondary | ICD-10-CM

## 2011-11-20 DIAGNOSIS — J45909 Unspecified asthma, uncomplicated: Secondary | ICD-10-CM

## 2011-11-20 LAB — POCT URINALYSIS DIPSTICK
Bilirubin, UA: NEGATIVE
Blood, UA: NEGATIVE
Glucose, UA: NEGATIVE
Ketones, UA: NEGATIVE
Leukocytes, UA: NEGATIVE
Nitrite, UA: NEGATIVE
Protein, UA: NEGATIVE
Spec Grav, UA: 1.02
Urobilinogen, UA: 0.2
pH, UA: 6.5

## 2011-11-20 LAB — BASIC METABOLIC PANEL
BUN: 15 mg/dL (ref 6–23)
CO2: 28 mEq/L (ref 19–32)
Calcium: 8.8 mg/dL (ref 8.4–10.5)
Creatinine, Ser: 0.8 mg/dL (ref 0.4–1.2)
GFR: 104.23 mL/min (ref >60.00)
Glucose, Bld: 91 mg/dL (ref 70–99)
Potassium: 4.6 mEq/L (ref 3.5–5.1)
Sodium: 135 mEq/L (ref 135–145)

## 2011-11-20 LAB — CBC WITH DIFFERENTIAL/PLATELET
Basophils Absolute: 0 10*3/uL (ref 0.0–0.1)
Eosinophils Absolute: 0.3 10*3/uL (ref 0.0–0.7)
Eosinophils Relative: 3.5 % (ref 0.0–5.0)
Hemoglobin: 13.1 g/dL (ref 12.0–15.0)
Lymphocytes Relative: 29.4 % (ref 12.0–46.0)
Lymphs Abs: 2.7 10*3/uL (ref 0.7–4.0)
MCHC: 32.1 g/dL (ref 30.0–36.0)
MCV: 92.3 fl (ref 78.0–100.0)
Monocytes Relative: 9.3 % (ref 3.0–12.0)
Neutrophils Relative %: 57.3 % (ref 43.0–77.0)
Platelets: 163 10*3/uL (ref 150.0–400.0)
RBC: 4.43 Mil/uL (ref 3.87–5.11)
RDW: 14.3 % (ref 11.5–14.6)
WBC: 9.1 10*3/uL (ref 4.5–10.5)

## 2011-11-20 MED ORDER — HYDROCODONE-HOMATROPINE 5-1.5 MG/5ML PO SYRP: ORAL_SOLUTION | ORAL | Status: DC

## 2011-11-20 MED ORDER — PREDNISONE 20 MG PO TABS: ORAL_TABLET | ORAL | Status: DC

## 2011-11-20 MED ORDER — BECLOMETHASONE DIPROPIONATE 80 MCG/ACT IN AERS: 1.0000 | INHALATION_SPRAY | Freq: Two times a day (BID) | RESPIRATORY_TRACT | Status: DC

## 2011-11-20 NOTE — Patient Instructions (Addendum)
Restart the prednisone 2 tabs daily for 5 days, 1 tab for 5 days, a half a tab for 5 days, then a half a tablet Monday Wednesday Friday for a 2 week taper  Hydromet 1/2-1 teaspoon 3 times daily when necessary for cough  Albuterol 1 or 2 puffs no more than 4 times daily  Continue the Qvar.......Marland Kitchen 1 puff twice daily  Return in one week for followup

## 2011-11-20 NOTE — Progress Notes (Signed)
  Subjective:    Patient ID: Jasmine [name], female    DOB: [birth year], 31 y.o.   MRN: [id number]  HPI Jasmine [name] is a 31 year old married female nonsmoker who comes in today for evaluation of asthma  She has had a long-standing history of allergic rhinitis and asthma and takes Qvar 80 does 1 puff twice a day albuterol when necessary and 3 weeks ago began wheezing. She took a 9 day course of prednisone 40 mg daily for 3 days with a taper it helped but it did not stop the wheezing. Now she is waking up at night short of breath and she's having to take the albuterol 3-4 times daily.  No fever no sputum production  She has a 50-year-old child at home. She had an ectopic on the left side prior to the birth of her first child. She went off her birth control 3 months ago with the goal to have another baby. She saw her GYN doctor Willette Brace in the winter check a Pap smear was normal.   Review of Systems General pulmonary GYN review of systems otherwise negative normal recent.    Objective:   Physical Exam Well-developed well-nourished female no acute distress examination of the lungs show symmetrical breath sounds late mild expiratory wheezing on forced expiration. Cardiac exam normal breast exam showed a small pea-sized lesion right breast at the 3:00 position next to the nipple  Abdominal exam normal       Assessment & Plan:  Asthma plan restart the prednisone ,,,,,,,,,,, continue the Qvar and albuterol every 4-6 hours when necessary. Followup in one week  Cystic lesion right breast observe

## 2011-11-27 ENCOUNTER — Ambulatory Visit (INDEPENDENT_AMBULATORY_CARE_PROVIDER_SITE_OTHER): Payer: BC Managed Care – PPO | Admitting: Family Medicine

## 2011-11-27 ENCOUNTER — Encounter: Payer: Self-pay | Admitting: Family Medicine

## 2011-11-27 VITALS — BP 110/70 | Temp 98.6°F

## 2011-11-27 DIAGNOSIS — J45909 Unspecified asthma, uncomplicated: Secondary | ICD-10-CM

## 2011-11-27 NOTE — Patient Instructions (Signed)
Qvar inhaler.........Marland Kitchen 1 puff twice daily  Taper prednisone as outlined  Return in one month for followup

## 2011-11-27 NOTE — Progress Notes (Signed)
  Subjective:    Patient ID: Jasmine [name], female    DOB: [birth year], 31 y.o.   MRN: [id number]  HPI Jasmine [name] is a 31 year old married female nonsmoker G1 P1 who comes in today for followup of asthma  We saw her a week ago and started on prednisone she has begin tapering she is started down to one tablet daily starting yesterday. She states she feels 75% better. We also gave her Qvar to begin which she has not started. She's not had to use the albuterol. She is taking some Hycodan at bedtime for sleep.   she also has a soft rubbery marble sized lesion right breast 12:00. Advised to continue BSE monthly   Review of Systems General and pulmonary review of systems otherwise negative    Objective:   Physical Exam Well-developed well-nourished female in acute distress lung exam normal no wheezing       Assessment & Plan:  Asthma plan Taper prednisone begin Qvar 1 puff twice daily all year round

## 2012-08-21 ENCOUNTER — Other Ambulatory Visit: Payer: Self-pay | Admitting: Family Medicine

## 2013-02-24 NOTE — L&D Delivery Note (Signed)
Delivery Note At 7:32 PM a healthy female was delivered via Vaginal, Spontaneous Delivery (Presentation: ; Occiput Anterior).  APGAR: 9, 9; weight 6 lb 1.5 oz (2764 g).   Placenta status: Intact, Spontaneous.  Cord: 3 vessels with the following complications: None.  Cord pH: not done  Anesthesia: Local  Episiotomy: None Lacerations: 2nd degree Suture Repair: 2.0 3.0 vicryl vicryl rapide Est. Blood Loss (mL):  300 Mom to postpartum.  Baby to Couplet care / Skin to Skin. Ptls 74.  Will repeat CBC in am.  [place],MARIE-LYNE 01/07/2014, 8:05 PM

## 2013-07-11 LAB — OB RESULTS CONSOLE GC/CHLAMYDIA
CHLAMYDIA, DNA PROBE: NEGATIVE
GC PROBE AMP, GENITAL: NEGATIVE

## 2013-07-11 LAB — OB RESULTS CONSOLE HEPATITIS B SURFACE ANTIGEN: Hepatitis B Surface Ag: NEGATIVE

## 2013-07-11 LAB — OB RESULTS CONSOLE ANTIBODY SCREEN: ANTIBODY SCREEN: NEGATIVE

## 2013-07-11 LAB — OB RESULTS CONSOLE HIV ANTIBODY (ROUTINE TESTING): HIV: NONREACTIVE

## 2013-07-11 LAB — OB RESULTS CONSOLE RUBELLA ANTIBODY, IGM: Rubella: IMMUNE

## 2013-07-11 LAB — OB RESULTS CONSOLE ABO/RH: RH Type: POSITIVE

## 2013-07-11 LAB — OB RESULTS CONSOLE RPR: RPR: NONREACTIVE

## 2013-12-09 ENCOUNTER — Ambulatory Visit: Payer: BC Managed Care – PPO

## 2013-12-09 ENCOUNTER — Ambulatory Visit: Payer: BC Managed Care – PPO | Admitting: Hematology and Oncology

## 2013-12-12 ENCOUNTER — Other Ambulatory Visit: Payer: BC Managed Care – PPO

## 2013-12-12 ENCOUNTER — Ambulatory Visit: Payer: BC Managed Care – PPO | Admitting: Internal Medicine

## 2013-12-12 ENCOUNTER — Ambulatory Visit: Payer: BC Managed Care – PPO

## 2013-12-19 ENCOUNTER — Ambulatory Visit (HOSPITAL_BASED_OUTPATIENT_CLINIC_OR_DEPARTMENT_OTHER): Payer: BC Managed Care – PPO | Admitting: Hematology and Oncology

## 2013-12-19 ENCOUNTER — Ambulatory Visit: Payer: BC Managed Care – PPO

## 2013-12-19 ENCOUNTER — Ambulatory Visit: Payer: BC Managed Care – PPO | Admitting: Hematology and Oncology

## 2013-12-19 ENCOUNTER — Encounter: Payer: Self-pay | Admitting: Hematology and Oncology

## 2013-12-19 ENCOUNTER — Telehealth: Payer: Self-pay | Admitting: Hematology and Oncology

## 2013-12-19 VITALS — BP 117/64 | HR 80 | Temp 98.0°F | Resp 18 | Ht 66.0 in | Wt 187.2 lb

## 2013-12-19 DIAGNOSIS — D696 Thrombocytopenia, unspecified: Secondary | ICD-10-CM

## 2013-12-19 DIAGNOSIS — O99019 Anemia complicating pregnancy, unspecified trimester: Secondary | ICD-10-CM | POA: Insufficient documentation

## 2013-12-19 DIAGNOSIS — O99013 Anemia complicating pregnancy, third trimester: Secondary | ICD-10-CM

## 2013-12-19 HISTORY — DX: Thrombocytopenia, unspecified: D69.6

## 2013-12-19 MED ORDER — PREDNISONE 20 MG PO TABS: 60.0000 mg | ORAL_TABLET | Freq: Every day | ORAL | Status: DC

## 2013-12-19 NOTE — Assessment & Plan Note (Signed)
The thrombocytopenia is likely related to her pregnancy. I recommend a trial of prednisone. I warn her about side effects of prednisone. She will begin 60 mg daily starting tomorrow and recheck CBC on a weekly basis. I will see her back 3 weeks from now for further assessment. If she had no response, she may benefit from IVIG.

## 2013-12-19 NOTE — Progress Notes (Signed)
Checked in new patient with no financial issues prior to seeing the dr. She has appt card and [place] not been out of the country. °

## 2013-12-19 NOTE — Assessment & Plan Note (Signed)
She will continue prenatal vitamin. She is not symptomatic.

## 2013-12-19 NOTE — Progress Notes (Signed)
[facility] CONSULT NOTE  Patient Care Team: Roderick PeeJeffrey A Todd, MD as PCP - General  CHIEF COMPLAINTS/PURPOSE OF CONSULTATION:   gestational thrombocytopenia  HISTORY OF PRESENTING ILLNESS:  Jasmine [name] 33 y.o. female is here because of thrombocytopenia.  She was found to have abnormal CBC from recent blood work by her obstetrician. The patient had one successful pregnancy 6 years ago in 2009 complicated by thrombocytopenia. In 2013, she had normal platelet count. Her platelet count was recently found to be low at 89,000, repeat was 105,000. She denies recent bruising/bleeding, such as spontaneous epistaxis, hematuria, melena or hematochezia She is currently [redacted] weeks pregnant. The patient denies history of liver disease, exposure to heparin, history of cardiac murmur/prior cardiovascular surgery or recent new medications She denies prior blood or platelet transfusions   MEDICAL HISTORY:  Past Medical History  Diagnosis Date  . Allergic rhinitis   . Asthma   . Thrombocytopenia 12/19/2013    SURGICAL HISTORY: Past Surgical History  Procedure Laterality Date  . Tubal resection      SOCIAL HISTORY: History   Social History  . Marital Status: Married    Spouse Name: N/A    Number of Children: N/A  . Years of Education: N/A   Occupational History  . Not on file.   Social History Main Topics  . Smoking status: Never Smoker   . Smokeless tobacco: Never Used  . Alcohol Use: No  . Drug Use: No  . Sexual Activity: Not on file   Other Topics Concern  . Not on file   Social History Narrative  . No narrative on file    FAMILY HISTORY: History reviewed. No pertinent family history.  ALLERGIES:  has No Known Allergies.  MEDICATIONS:  Current Outpatient Prescriptions  Medication Sig Dispense Refill  . albuterol (PROAIR HFA) 108 (90 BASE) MCG/ACT inhaler Inhale 2 puffs into the lungs 3 (three) times daily as needed. 1-2 puffs three times a day as needed        . beclomethasone (QVAR) 80 MCG/ACT inhaler Inhale 1 puff into the lungs 2 (two) times daily as needed.      . Prenatal Multivit-Min-Fe-FA (PRE-NATAL PO) Take by mouth daily.      . predniSONE (DELTASONE) 20 MG tablet Take 3 tablets (60 mg total) by mouth daily with breakfast.  90 tablet  1   No current facility-administered medications for this visit.    REVIEW OF SYSTEMS:   Constitutional: Denies fevers, chills or abnormal night sweats Eyes: Denies blurriness of vision, double vision or watery eyes Ears, nose, mouth, throat, and face: Denies mucositis or sore throat Respiratory: Denies cough, dyspnea or wheezes Cardiovascular: Denies palpitation, chest discomfort or lower extremity swelling Gastrointestinal:  Denies nausea, heartburn or change in bowel habits Skin: Denies abnormal skin rashes Lymphatics: Denies new lymphadenopathy or easy bruising Neurological:Denies numbness, tingling or new weaknesses Behavioral/Psych: Mood is stable, no new changes  All other systems were reviewed with the patient and are negative.  PHYSICAL EXAMINATION: ECOG PERFORMANCE STATUS: 0 - Asymptomatic  Filed Vitals:   12/19/13 1416  BP: 117/64  Pulse: 80  Temp: 98 F (36.7 C)  Resp: 18   Filed Weights   12/19/13 1416  Weight: 187 lb 3.2 oz (84.913 kg)    GENERAL:alert, no distress and comfortable SKIN: skin color, texture, turgor are normal, no rashes or significant lesions EYES: normal, conjunctiva are pink and non-injected, sclera clear OROPHARYNX:no exudate, no erythema and lips, buccal mucosa, and tongue  normal  NECK: supple, thyroid normal size, non-tender, without nodularity LYMPH:  no palpable lymphadenopathy in the cervical, axillary or inguinal LUNGS: clear to auscultation and percussion with normal breathing effort HEART: regular rate & rhythm and no murmurs and no lower extremity edema ABDOMEN:abdomen soft, non-tender and normal bowel sounds Musculoskeletal:no cyanosis of  digits and no clubbing  PSYCH: alert & oriented x 3 with fluent speech NEURO: no focal motor/sensory deficits  LABORATORY DATA:  I have reviewed the data as listed  ASSESSMENT & PLAN  Thrombocytopenia The thrombocytopenia is likely related to her pregnancy. I recommend a trial of prednisone. I warn her about side effects of prednisone. She will begin 60 mg daily starting tomorrow and recheck CBC on a weekly basis. I will see her back 3 weeks from now for further assessment. If she had no response, she may benefit from IVIG.  Anemia in pregnancy  She will continue prenatal vitamin. She is not symptomatic.

## 2013-12-19 NOTE — Telephone Encounter (Signed)
Gave avs & cal for Nov.  °

## 2013-12-26 ENCOUNTER — Other Ambulatory Visit (HOSPITAL_BASED_OUTPATIENT_CLINIC_OR_DEPARTMENT_OTHER): Payer: BC Managed Care – PPO

## 2013-12-26 ENCOUNTER — Telehealth: Payer: Self-pay | Admitting: *Deleted

## 2013-12-26 DIAGNOSIS — O99013 Anemia complicating pregnancy, third trimester: Secondary | ICD-10-CM

## 2013-12-26 DIAGNOSIS — D696 Thrombocytopenia, unspecified: Secondary | ICD-10-CM

## 2013-12-26 LAB — CBC WITH DIFFERENTIAL/PLATELET
BASO%: 0.3 % (ref 0.0–2.0)
BASOS ABS: 0 10*3/uL (ref 0.0–0.1)
EOS ABS: 0.1 10*3/uL (ref 0.0–0.5)
EOS%: 0.7 % (ref 0.0–7.0)
HEMATOCRIT: 32.3 % — AB (ref 34.8–46.6)
HEMOGLOBIN: 10.7 g/dL — AB (ref 11.6–15.9)
LYMPH#: 2.9 10*3/uL (ref 0.9–3.3)
LYMPH%: 27 % (ref 14.0–49.7)
MCH: 30.2 pg (ref 25.1–34.0)
MCHC: 33.1 g/dL (ref 31.5–36.0)
MCV: 91.2 fL (ref 79.5–101.0)
MONO#: 1 10*3/uL — AB (ref 0.1–0.9)
MONO%: 9.6 % (ref 0.0–14.0)
NEUT%: 62.4 % (ref 38.4–76.8)
NEUTROS ABS: 6.6 10*3/uL — AB (ref 1.5–6.5)
Platelets: 110 10*3/uL — ABNORMAL LOW (ref 145–400)
RBC: 3.54 10*6/uL — ABNORMAL LOW (ref 3.70–5.45)
RDW: 13.7 % (ref 11.2–14.5)
WBC: 10.6 10*3/uL — AB (ref 3.9–10.3)
nRBC: 0 % (ref 0–0)

## 2013-12-26 NOTE — Telephone Encounter (Signed)
Dr. Bertis RuddyGorsuch would like patient to reduce prednisone to 40 mg daily based on today's lab results.  Called Ms. Skare with results and instructions.  Patient says she just spoke with her OB/GYN who would like results.  Routed to Dr. Braxton Feathersavvon and these will be sent via fax per EPIC.

## 2014-01-02 ENCOUNTER — Other Ambulatory Visit (HOSPITAL_BASED_OUTPATIENT_CLINIC_OR_DEPARTMENT_OTHER): Payer: BC Managed Care – PPO

## 2014-01-02 ENCOUNTER — Telehealth: Payer: Self-pay | Admitting: *Deleted

## 2014-01-02 DIAGNOSIS — D696 Thrombocytopenia, unspecified: Secondary | ICD-10-CM

## 2014-01-02 DIAGNOSIS — O99013 Anemia complicating pregnancy, third trimester: Secondary | ICD-10-CM

## 2014-01-02 LAB — CBC WITH DIFFERENTIAL/PLATELET
BASO%: 0.3 % (ref 0.0–2.0)
Basophils Absolute: 0 10*3/uL (ref 0.0–0.1)
EOS%: 0 % (ref 0.0–7.0)
Eosinophils Absolute: 0 10*3/uL (ref 0.0–0.5)
HCT: 34.9 % (ref 34.8–46.6)
HGB: 11 g/dL — ABNORMAL LOW (ref 11.6–15.9)
LYMPH#: 1 10*3/uL (ref 0.9–3.3)
LYMPH%: 8.1 % — ABNORMAL LOW (ref 14.0–49.7)
MCH: 29.3 pg (ref 25.1–34.0)
MCHC: 31.5 g/dL (ref 31.5–36.0)
MCV: 93 fL (ref 79.5–101.0)
MONO#: 0.4 10*3/uL (ref 0.1–0.9)
MONO%: 3.1 % (ref 0.0–14.0)
NEUT#: 10.4 10*3/uL — ABNORMAL HIGH (ref 1.5–6.5)
NEUT%: 88.5 % — ABNORMAL HIGH (ref 38.4–76.8)
Platelets: 101 10*3/uL — ABNORMAL LOW (ref 145–400)
RBC: 3.75 10*6/uL (ref 3.70–5.45)
RDW: 14 % (ref 11.2–14.5)
WBC: 11.7 10*3/uL — AB (ref 3.9–10.3)

## 2014-01-02 NOTE — Telephone Encounter (Signed)
-----   Message from Rolanda JayPhonacelle C Windham, RN sent at 01/02/2014  4:11 PM EST ----- Regarding: FW: cbc   ----- Message -----    From: Artis DelayNi Gorsuch, MD    Sent: 01/02/2014   4:03 PM      To: Rolanda JayPhonacelle C Windham, RN Subject: cbc                                            No change, keep at 40 mg for now ----- Message -----    From: [facility] Interface    Sent: 01/02/2014   3:20 PM      To: Artis DelayNi Gorsuch, MD

## 2014-01-03 ENCOUNTER — Telehealth: Payer: Self-pay | Admitting: *Deleted

## 2014-01-03 NOTE — Telephone Encounter (Signed)
Informed pt of Platelet count yesterday essentially stable and to continue on Prednisone 40 mg daily.  Keep appt lab/MD next week as scheduled. She verbalized understanding.

## 2014-01-07 ENCOUNTER — Inpatient Hospital Stay (HOSPITAL_COMMUNITY)
Admission: AD | Admit: 2014-01-07 | Discharge: 2014-01-09 | DRG: 775 | Disposition: A | Discharge status: Home / Self Care | Payer: BC Managed Care – PPO | Source: Ambulatory Visit | Attending: Obstetrics & Gynecology | Admitting: Obstetrics & Gynecology

## 2014-01-07 ENCOUNTER — Encounter (HOSPITAL_COMMUNITY): Payer: Self-pay | Admitting: *Deleted

## 2014-01-07 DIAGNOSIS — D649 Anemia, unspecified: Secondary | ICD-10-CM | POA: Diagnosis present

## 2014-01-07 DIAGNOSIS — IMO0001 Reserved for inherently not codable concepts without codable children: Secondary | ICD-10-CM

## 2014-01-07 DIAGNOSIS — D696 Thrombocytopenia, unspecified: Secondary | ICD-10-CM | POA: Diagnosis present

## 2014-01-07 DIAGNOSIS — D693 Immune thrombocytopenic purpura: Secondary | ICD-10-CM | POA: Diagnosis present

## 2014-01-07 DIAGNOSIS — O9902 Anemia complicating childbirth: Secondary | ICD-10-CM | POA: Diagnosis present

## 2014-01-07 DIAGNOSIS — O9912 Other diseases of the blood and blood-forming organs and certain disorders involving the immune mechanism complicating childbirth: Secondary | ICD-10-CM | POA: Diagnosis present

## 2014-01-07 DIAGNOSIS — Z3A36 36 weeks gestation of pregnancy: Secondary | ICD-10-CM | POA: Diagnosis present

## 2014-01-07 HISTORY — DX: Reserved for inherently not codable concepts without codable children: IMO0001

## 2014-01-07 LAB — CBC
HCT: 34.9 % — ABNORMAL LOW (ref 36.0–46.0)
Hemoglobin: 11.6 g/dL — ABNORMAL LOW (ref 12.0–15.0)
MCH: 30.7 pg (ref 26.0–34.0)
MCHC: 33.2 g/dL (ref 30.0–36.0)
MCV: 92.3 fL (ref 78.0–100.0)
PLATELETS: 74 10*3/uL — AB (ref 150–400)
RBC: 3.78 MIL/uL — ABNORMAL LOW (ref 3.87–5.11)
RDW: 14.3 % (ref 11.5–15.5)
WBC: 11.5 10*3/uL — ABNORMAL HIGH (ref 4.0–10.5)

## 2014-01-07 LAB — TYPE AND SCREEN
ABO/RH(D): O POS
ANTIBODY SCREEN: NEGATIVE

## 2014-01-07 LAB — OB RESULTS CONSOLE GBS: GBS: NEGATIVE

## 2014-01-07 LAB — GROUP B STREP BY PCR: Group B strep by PCR: NEGATIVE

## 2014-01-07 MED ORDER — ZOLPIDEM TARTRATE 5 MG PO TABS: 5.0000 mg | ORAL_TABLET | Freq: Every evening | ORAL | Status: DC | PRN

## 2014-01-07 MED ORDER — OXYCODONE-ACETAMINOPHEN 5-325 MG PO TABS: 2.0000 | ORAL_TABLET | ORAL | Status: DC | PRN

## 2014-01-07 MED ORDER — FENTANYL 2.5 MCG/ML BUPIVACAINE 1/10 % EPIDURAL INFUSION (WH - ANES): 14.0000 mL/h | INTRAMUSCULAR | Status: DC | PRN

## 2014-01-07 MED ORDER — TERBUTALINE SULFATE 1 MG/ML IJ SOLN
0.2500 mg | Freq: Once | INTRAMUSCULAR | Status: AC
  Administered 2014-01-07: 0.25 mg via SUBCUTANEOUS
  Filled 2014-01-07: qty 1

## 2014-01-07 MED ORDER — EPHEDRINE 5 MG/ML INJ
10.0000 mg | INTRAVENOUS | Status: DC | PRN
  Filled 2014-01-07: qty 2

## 2014-01-07 MED ORDER — AMPICILLIN SODIUM 2 G IJ SOLR
2.0000 g | Freq: Four times a day (QID) | INTRAMUSCULAR | Status: DC
Start: 2014-01-07 — End: 2014-01-07
  Administered 2014-01-07: 2 g via INTRAVENOUS
  Filled 2014-01-07 (×2): qty 2000

## 2014-01-07 MED ORDER — CITRIC ACID-SODIUM CITRATE 334-500 MG/5ML PO SOLN: 30.0000 mL | ORAL | Status: DC | PRN

## 2014-01-07 MED ORDER — OXYTOCIN 10 UNIT/ML IJ SOLN
20.0000 [IU] | Freq: Once | INTRAMUSCULAR | Status: AC
Start: 2014-01-07 — End: 2014-01-07
  Administered 2014-01-07: 20 [IU] via INTRAMUSCULAR

## 2014-01-07 MED ORDER — SENNOSIDES-DOCUSATE SODIUM 8.6-50 MG PO TABS
2.0000 | ORAL_TABLET | ORAL | Status: DC
  Administered 2014-01-09: 2 via ORAL

## 2014-01-07 MED ORDER — ONDANSETRON HCL 4 MG/2ML IJ SOLN: 4.0000 mg | Freq: Four times a day (QID) | INTRAMUSCULAR | Status: DC | PRN

## 2014-01-07 MED ORDER — FENTANYL CITRATE 0.05 MG/ML IJ SOLN
100.0000 ug | Freq: Once | INTRAMUSCULAR | Status: AC
  Administered 2014-01-07: 100 ug via INTRAVENOUS

## 2014-01-07 MED ORDER — WITCH HAZEL-GLYCERIN EX PADS: 1.0000 "application " | MEDICATED_PAD | CUTANEOUS | Status: DC | PRN

## 2014-01-07 MED ORDER — LIDOCAINE HCL (PF) 1 % IJ SOLN
30.0000 mL | INTRAMUSCULAR | Status: DC | PRN
  Administered 2014-01-07: 30 mL via SUBCUTANEOUS
  Filled 2014-01-07: qty 30

## 2014-01-07 MED ORDER — DIBUCAINE 1 % RE OINT: 1.0000 "application " | TOPICAL_OINTMENT | RECTAL | Status: DC | PRN

## 2014-01-07 MED ORDER — ONDANSETRON HCL 4 MG PO TABS: 4.0000 mg | ORAL_TABLET | ORAL | Status: DC | PRN

## 2014-01-07 MED ORDER — OXYTOCIN 40 UNITS IN LACTATED RINGERS INFUSION - SIMPLE MED: 62.5000 mL/h | INTRAVENOUS | Status: DC | PRN

## 2014-01-07 MED ORDER — PHENYLEPHRINE 40 MCG/ML (10ML) SYRINGE FOR IV PUSH (FOR BLOOD PRESSURE SUPPORT)
80.0000 ug | PREFILLED_SYRINGE | INTRAVENOUS | Status: DC | PRN
  Filled 2014-01-07: qty 2

## 2014-01-07 MED ORDER — OXYTOCIN 10 UNIT/ML IJ SOLN
INTRAMUSCULAR | Status: AC
  Filled 2014-01-07: qty 2

## 2014-01-07 MED ORDER — IBUPROFEN 600 MG PO TABS: 600.0000 mg | ORAL_TABLET | Freq: Four times a day (QID) | ORAL | Status: DC

## 2014-01-07 MED ORDER — DIPHENHYDRAMINE HCL 50 MG/ML IJ SOLN: 12.5000 mg | INTRAMUSCULAR | Status: DC | PRN

## 2014-01-07 MED ORDER — OXYCODONE-ACETAMINOPHEN 5-325 MG PO TABS: 1.0000 | ORAL_TABLET | ORAL | Status: DC | PRN

## 2014-01-07 MED ORDER — LACTATED RINGERS IV SOLN: 500.0000 mL | INTRAVENOUS | Status: DC | PRN

## 2014-01-07 MED ORDER — ACETAMINOPHEN 325 MG PO TABS: 650.0000 mg | ORAL_TABLET | ORAL | Status: DC | PRN

## 2014-01-07 MED ORDER — OXYTOCIN 40 UNITS IN LACTATED RINGERS INFUSION - SIMPLE MED
62.5000 mL/h | Freq: Once | INTRAVENOUS | Status: DC | PRN
  Filled 2014-01-07: qty 1000

## 2014-01-07 MED ORDER — ONDANSETRON HCL 4 MG/2ML IJ SOLN: 4.0000 mg | INTRAMUSCULAR | Status: DC | PRN

## 2014-01-07 MED ORDER — OXYCODONE-ACETAMINOPHEN 5-325 MG PO TABS
1.0000 | ORAL_TABLET | ORAL | Status: DC | PRN
  Administered 2014-01-07 – 2014-01-09 (×5): 1 via ORAL
  Filled 2014-01-07 (×5): qty 1

## 2014-01-07 MED ORDER — LACTATED RINGERS IV SOLN
500.0000 mL | Freq: Once | INTRAVENOUS | Status: AC
  Administered 2014-01-07: 1000 mL via INTRAVENOUS

## 2014-01-07 MED ORDER — LACTATED RINGERS IV SOLN: INTRAVENOUS | Status: DC

## 2014-01-07 MED ORDER — SIMETHICONE 80 MG PO CHEW: 80.0000 mg | CHEWABLE_TABLET | ORAL | Status: DC | PRN

## 2014-01-07 MED ORDER — BENZOCAINE-MENTHOL 20-0.5 % EX AERO
1.0000 "application " | INHALATION_SPRAY | CUTANEOUS | Status: DC | PRN
  Administered 2014-01-08 – 2014-01-09 (×2): 1 via TOPICAL
  Filled 2014-01-07 (×3): qty 56

## 2014-01-07 MED ORDER — FENTANYL CITRATE 0.05 MG/ML IJ SOLN
INTRAMUSCULAR | Status: AC
  Filled 2014-01-07: qty 2

## 2014-01-07 MED ORDER — DIPHENHYDRAMINE HCL 25 MG PO CAPS
25.0000 mg | ORAL_CAPSULE | Freq: Four times a day (QID) | ORAL | Status: DC | PRN
Start: 2014-01-07 — End: 2014-01-09

## 2014-01-07 MED ORDER — TETANUS-DIPHTH-ACELL PERTUSSIS 5-2.5-18.5 LF-MCG/0.5 IM SUSP
0.5000 mL | Freq: Once | INTRAMUSCULAR | Status: AC
  Administered 2014-01-08: 0.5 mL via INTRAMUSCULAR

## 2014-01-07 MED ORDER — PRENATAL MULTIVITAMIN CH
1.0000 | ORAL_TABLET | Freq: Every day | ORAL | Status: DC
  Administered 2014-01-08 – 2014-01-09 (×3): 1 via ORAL
  Filled 2014-01-07 (×2): qty 1

## 2014-01-07 MED ORDER — OXYTOCIN BOLUS FROM INFUSION: 500.0000 mL | Freq: Once | INTRAVENOUS | Status: DC | PRN

## 2014-01-07 MED ORDER — LANOLIN HYDROUS EX OINT
TOPICAL_OINTMENT | CUTANEOUS | Status: DC | PRN
Start: 2014-01-07 — End: 2014-01-09

## 2014-01-07 NOTE — H&P (Signed)
Jasmine Marcina MillardM [name] is a 33 y.o. female [phone] 6763w2d presenting for active labor.  OB History    Gravida Para Term Preterm AB TAB SAB Ectopic Multiple Living   5 1 1  0 3 1 1 1  0 1      Obstetric Comments   Last baby was 3 weeks early in 2009.     Past Medical History  Diagnosis Date  . Allergic rhinitis   . Asthma   . Thrombocytopenia 12/19/2013  . Active labor 01/07/2014   Past Surgical History  Procedure Laterality Date  . Tubal resection     Family History: family history is not on file. Social History:  reports that she has never smoked. She has never used smokeless tobacco. She reports that she does not drink alcohol or use illicit drugs.  No Known Allergies  Dilation: 8 Effacement (%): 100 Station: -2 Exam by:: Dr Seymour BarsLavoie  Bulging membranes  Blood pressure 110/61, pulse 91, temperature 98.9 F (37.2 C), temperature source Axillary, resp. rate 20, height 5\' 7"  (1.702 m), weight 87.091 kg (192 lb). Exam Physical Exam  HPP:  Patient Active Problem List   Diagnosis Date Noted  . Active labor 01/07/2014  . Labor and delivery indication for care or intervention 01/07/2014  . Thrombocytopenia 12/19/2013  . Anemia in pregnancy 12/19/2013  . Cyst of breast, right, benign solitary 11/20/2011  . ALLERGIC RHINITIS 12/06/2008  . ASTHMA 12/06/2008  . ALLERGIC RHINITIS DUE TO POLLEN 01/27/2007    Prenatal labs: ABO, Rh: O/Positive/-- (05/18 0000) Antibody: Negative (05/18 0000) Rubella:  Immune RPR: Nonreactive (05/18 0000)  HBsAg: Negative (05/18 0000)  HIV: Non-reactive (05/18 0000)  Genetic testing: Informaseq neg, AFP1 neg US anato: wnl 1 hr GTT: wnl GBS:  Rapid test pending  Assessment/Plan: 36 + wks in active labor.  Thrombocytopenia, pending Plts (last 101 after 1 dose of Prednisone by Hemato).  FHR monitoring reassuring.  Rapid GBS pending.  Will give Ampi IV until result available.  Preparing for delivery.   Suhas Estis,MARIE-LYNE 01/07/2014, 6:40 PM

## 2014-01-07 NOTE — Progress Notes (Signed)
Patient ID: Glena Norfolkiffany M Cullipher, female   DOB: 08-02-1980, 33 y.o.   MRN: [id number] Glena Norfolkiffany M Ilagan is a 33 y.o. [id number] at 217w2d presenting for labor evaluation. Pt notes irregular contractions every 5-7 mins . Good fetal movement, No vaginal bleeding. Prenatal Course Source of Care:Regular OB care  with onset of care at 10.4 weeks Pregnancy complications or risk:Thrombocytopenia and uterine fibroids Current medications:  Medications Prior to Admission  Medication Sig Dispense Refill  . albuterol (PROAIR HFA) 108 (90 BASE) MCG/ACT inhaler Inhale 2 puffs into the lungs 3 (three) times daily as needed. 1-2 puffs three times a day as needed     . predniSONE (DELTASONE) 20 MG tablet Take 3 tablets (60 mg total) by mouth daily with breakfast. (Patient taking differently: Take 40 mg by mouth daily with breakfast. ) 90 tablet 1  . Prenatal Multivit-Min-Fe-FA (PRE-NATAL PO) Take by mouth daily.    . beclomethasone (QVAR) 80 MCG/ACT inhaler Inhale 1 puff into the lungs 2 (two) times daily as needed.      OB History    Gravida Para Term Preterm AB TAB SAB Ectopic Multiple Living   5 1 1  0 3 1 1 1  0 1      Obstetric Comments   Last baby was 3 weeks early in 2009.     Past Medical History  Diagnosis Date  . Allergic rhinitis   . Asthma   . Thrombocytopenia 12/19/2013  . Active labor 01/07/2014   Past Surgical History  Procedure Laterality Date  . Tubal resection     Family History: family history is not on file. Social History:  reports that she has never smoked. She has never used smokeless tobacco. She reports that she does not drink alcohol or use illicit drugs.  Review of Systems - Negative except contractions every 5-10 mins   Blood pressure 122/61, pulse 86, temperature 98.9 F (37.2 C), temperature source Axillary, resp. rate 18, height 5\' 7"  (1.702 m), weight 87.091 kg (192 lb).  Physical Exam:  General: NAD Heart: RRR, no murmurs Lungs: CTA b/l  Abd: Soft, NT, S=D Ext: no  edema Neuro: DTRs normal    Membranes: intact / BBOW Vaginal bleeding:bloody show  Digital Cervical Exam # 1: Dilatation 4   Effacement 80  Station -2  Presentation vertex  Digital Cervical Exam # 2: Dilatation 6-7   Effacement 90  Station -1  Presentation vertex   FHR:  Baseline rate 145   Variability moderate  Accelerations present  Decelerations absent Contractions: Frequency 5-7  Intensity moderate  Pertinent Labs/Studies:  Thrombocytopenia / GBS pending  Prenatal labs: ABO, Rh: O Positive (11/14 1756) Antibody: NEG (11/14 1756) Rubella:  Immune RPR: Nonreactive (05/18 0000)  HBsAg: Negative (05/18 0000)  HIV: Non-reactive (05/18 0000)  GBS: Negative (11/14 0000)  1 hr Glucola Normal  Genetic screening Normal   Assessment: 33 y.o. G5P1031 at 387w2d  1. Labor: Active 2. Fetal Wellbeing: Category 1  3. Pain Control: none 4. GBS: unknown 5. Thrombocytopenia  Plan:  1. Observe x 1 hour 2. Recheck cervix in 1 hour 3. Terbutaline 0.25 mg SQ x 1 dose 4. Admit to L&D, if cervical change 5. Rapid GBS 6. PCN G 5 million units PB  Consultant: Dr. Billy Coastaavon - recommend terbutaline     Kenard GowerAWSON, Shevawn Langenberg, M, MSN, CNM 01/07/2014, 8:23 PM

## 2014-01-07 NOTE — MAU Note (Signed)
Contractions started at 0100.  q 5-5615min.  Spotting yesterday.  Mucous plug this morning.

## 2014-01-07 NOTE — Progress Notes (Signed)
Dr Seymour BarsLavoie called.  Plt count reported and Fentanyl ordered

## 2014-01-07 NOTE — MAU Note (Signed)
Dr. Billy Coastaavon aware of pt. Progress. Orders for admission given through Raelyn Moraolitta Dawson, CNM. Herbert SetaHeather, RN charge nurse in birthing suites given report and strip reviewed at this time. Will start IV and pt. Is ok to be moved to birthing suites 172 at this time.

## 2014-01-07 NOTE — Plan of Care (Signed)
Problem: Consults Goal: Automotive engineer Patient Education (See Patient Education module for education specifics.)  Outcome: Completed/Met Date Met:  01/07/14 Goal: Birthing Suites Patient Information Press F2 to bring up selections list  Outcome: Completed/Met Date Met:  01/07/14  Pt < [redacted] weeks EGA Goal: Skin Care Protocol Initiated - if Braden Score 18 or less If consults are not indicated, leave blank or document N/A  Outcome: Not Applicable Date Met:  02/25/70 Goal: Chaplain Consult Outcome: Not Applicable Date Met:  53/66/44 Goal: Case Manager Consult Outcome: Not Applicable Date Met:  03/47/42 Goal: Lactation Consult Initiated if indicated Outcome: Not Applicable Date Met:  59/56/38 Goal: Home Health Consult Outcome: Not Applicable Date Met:  75/64/33 Goal: Neonatologist Consult Outcome: Not Applicable Date Met:  29/51/88 Goal: Nutrition Consult-if indicated Outcome: Not Applicable Date Met:  41/66/06 Goal: Perinatal education as indicated Outcome: Not Applicable Date Met:  30/16/01 Goal: Physical Therapy Consult Outcome: Not Applicable Date Met:  09/32/35 Goal: Social Worker Consult (Birthing Suites) Outcome: Not Applicable Date Met:  57/32/20 Goal: NICU Tour Outcome: Not Applicable Date Met:  25/42/70 Goal: Diabetes Guidelines per MD order/protocol Outcome: Not Applicable Date Met:  62/37/62 Goal: Prenatal labs/testing reviewed upon admission Outcome: Completed/Met Date Met:  01/07/14 Goal: Orientation to unit: Plan of Care Outcome: Completed/Met Date Met:  01/07/14 Goal: Orientation to unit: Room Outcome: Completed/Met Date Met:  01/07/14 Goal: Orientation to unit: [place] (smoking, visitation, chaplain services, helpline)  Outcome: Completed/Met Date Met:  01/07/14 Goal: Orientation to unit: Other (Specify with a note) Outcome: Completed/Met Date Met:  01/07/14  Problem: Phase I Progression  Outcomes Goal: Assess per MD/Nurse,Routine-VS,FHR,UC,Head to Toe assess Outcome: Completed/Met Date Met:  01/07/14 Goal: Obtain and review prenatal records Outcome: Completed/Met Date Met:  01/07/14 Goal: Pain controlled with appropriate interventions Outcome: Completed/Met Date Met:  01/07/14 Patient unable to get epidural    Goal: OOB as tolerated unless otherwise ordered Outcome: Completed/Met Date Met:  01/07/14 Goal: Tolerating diet Outcome: Completed/Met Date Met:  01/07/14 Goal: Adequate progression of labor Outcome: Completed/Met Date Met:  01/07/14 Goal: Medications/IV Fluids N/A Outcome: Completed/Met Date Met:  01/07/14 Goal: Induction meds as ordered Outcome: Not Applicable Date Met:  83/15/17 Goal: IV Pain medications as ordered Outcome: Completed/Met Date Met:  01/07/14 Goal: Pitocin as ordered Outcome: Not Applicable Date Met:  61/60/73 Goal: FHR checked 5 minutes after meds (ROM) Rupture of Membranes Outcome: Completed/Met Date Met:  01/07/14 Goal: Assess/evaluate labor progress and adequacy Outcome: Completed/Met Date Met:  01/07/14 Goal: Assess/evaluate cervical exam prn (q2hrs in active phase) Outcome: Completed/Met Date Met:  01/07/14 Goal: Assess/evaluate notify MD when complete/8 cm Outcome: Completed/Met Date Met:  01/07/14 Goal: Assess/evaluate effectiveness of pushing Outcome: Completed/Met Date Met:  01/07/14 Goal: Complete instrument count Outcome: Completed/Met Date Met:  01/07/14 Goal: Count of instrument items (Specify in a note) Outcome: Completed/Met Date Met:  01/07/14 Goal: Discharge home if all goals are met Outcome: Not Applicable Date Met:  71/06/26 Goal: Appropriate patient level of comfort Outcome: Completed/Met Date Met:  01/07/14 Goal: Medical plan of care initiated within 2 hrs of admission Outcome: Completed/Met Date Met:  01/07/14 Goal: Other Phase I Outcomes/Goals Outcome: Not Applicable Date Met:  94/85/46  Problem: Phase II  Progression Outcomes Goal: Fetal monitoring per orders Outcome: Completed/Met Date Met:  01/07/14 Goal: Key steps for effective pushing & educate pt/family Outcome: Completed/Met Date Met:  01/07/14 Goal: Frequent position change(s) Outcome: Completed/Met Date Met:  01/07/14 Goal: Empty bladder prn Outcome: Completed/Met Date Met:  01/07/14 Goal: Pushing aids - rope, bar, perineal massage Outcome: Completed/Met Date Met:  01/07/14 Goal: Adjust medications prn Outcome: Not Applicable Date Met:  27/03/50 Goal: Notify MD prior to epidural redose Outcome: Not Applicable Date Met:  09/38/18 Goal: Allow 10 minute rest periods prn Outcome: Not Applicable Date Met:  29/93/71 Goal: Notify MD after 2hr pushing Outcome: Not Applicable Date Met:  69/67/89 Goal: Activity as indicated Outcome: Completed/Met Date Met:  01/07/14 Goal: Clear liquid diet Outcome: Completed/Met Date Met:  01/07/14 Goal: Regular diet Outcome: Completed/Met Date Met:  01/07/14 Goal: Delivery infant/placenta Outcome: Completed/Met Date Met:  01/07/14 Goal: Perineum clean and intact Outcome: Completed/Met Date Met:  01/07/14 Goal: Assess lochia color, amount, odor, clots, clots size Outcome: Completed/Met Date Met:  01/07/14 Goal: Fundus firm and non-displaced Outcome: Completed/Met Date Met:  01/07/14 Goal: Initial bonding Outcome: Completed/Met Date Met:  01/07/14 Goal: Skin to skin contact initiated Outcome: Completed/Met Date Met:  01/07/14 Goal: Initiate breastfeeding within 1hr delivery Outcome: Completed/Met Date Met:  01/07/14 Goal: Other Phase II Outcomes/Goals Outcome: Completed/Met Date Met:  01/07/14  Problem: Discharge Progression Outcomes Goal: Elimination assessed prior to transfer Outcome: Completed/Met Date Met:  01/07/14 Goal: Prior to transfer bladder non-distended uterus non-displaced Outcome: Completed/Met Date Met:  01/07/14 Goal: Voids prior to transfer Outcome: Completed/Met Date Met:   01/07/14 Goal: Catheter in place prior to transfer Outcome: Not Applicable Date Met:  38/10/17 Goal: Pericare completed prior to transfer Outcome: Completed/Met Date Met:  01/07/14 Goal: Transfer to Post Partum room/infant to nursery Outcome: Completed/Met Date Met:  01/07/14 Goal: Prior to transfer complete patient charges Outcome: Completed/Met Date Met:  01/07/14 Goal: Prior to transfer discontinue epidural Outcome: Not Applicable Date Met:  51/02/58 Goal: Patient discharged (AMA) against medical advice Outcome: Not Applicable Date Met:  52/77/82 Goal: Patient transferred to postpartum unit Outcome: Completed/Met Date Met:  01/07/14 Goal: Patient to be placed on appropriate new pathway Outcome: Completed/Met Date Met:  01/07/14 Goal: Discharge goals assessed Outcome: Completed/Met Date Met:  01/07/14 Goal: Pain controlled with appropriate interventions Outcome: Completed/Met Date Met:  01/07/14 Goal: Tolerating diet Outcome: Completed/Met Date Met:  01/07/14 Goal: Other Discharge Outcomes/Goals Outcome: Not Applicable Date Met:  42/35/36

## 2014-01-08 ENCOUNTER — Encounter (HOSPITAL_COMMUNITY): Payer: Self-pay | Admitting: Obstetrics and Gynecology

## 2014-01-08 LAB — CBC
HCT: 31.7 % — ABNORMAL LOW (ref 36.0–46.0)
Hemoglobin: 10.6 g/dL — ABNORMAL LOW (ref 12.0–15.0)
MCH: 31.1 pg (ref 26.0–34.0)
MCHC: 33.4 g/dL (ref 30.0–36.0)
MCV: 93 fL (ref 78.0–100.0)
PLATELETS: 71 10*3/uL — AB (ref 150–400)
RBC: 3.41 MIL/uL — ABNORMAL LOW (ref 3.87–5.11)
RDW: 14.4 % (ref 11.5–15.5)
WBC: 16.1 10*3/uL — AB (ref 4.0–10.5)

## 2014-01-08 LAB — ABO/RH: ABO/RH(D): O POS

## 2014-01-08 LAB — RPR

## 2014-01-08 MED ORDER — PREDNISONE 20 MG PO TABS
30.0000 mg | ORAL_TABLET | Freq: Every day | ORAL | Status: DC
  Administered 2014-01-08 – 2014-01-09 (×2): 30 mg via ORAL
  Filled 2014-01-08 (×2): qty 1

## 2014-01-08 NOTE — Lactation Note (Signed)
This note was copied from the chart of Jasmine [name]. Lactation Consultation Note: LPI at 36.2 weeks is now 7516 hours old. Infant has breastfed 6 times with latch scores of 8-9. Mother is experienced with breastfeeding her 33 year old who was also a LPI at 36-[redacted] weeks gestation. Mother states she breastfed her first child for 10 months without supplementing. [facility]. Observed infant with rhythmic suckles and audible swallows. Infant sustained latch for 30 mins. Mother was taught hand expression and infant was given 2 ml of colostrum with a spoon. Assist mother with latching infant to alternate breast in cross cradle hold. Infant sustained latch for 10 mins. Reviewed treatment plan for LPI and mother was given parent instruction sheet. DEBP was sat up with instructions to post pump on premie setting after each feeding. Discussed possible use of formula if unable to provide needed supplement with EBM. Mother receptive to plan of care. Advised mother to feed infant 8-12 times in 24 hours. [facility]. Mother was given Lactation Brochure and review of basics. Staff nurse to follow up and assist with pumping. Mother had large number of visitors and will pump later.   Patient Name: Jasmine [name] ZOXWR'UToday's Date: 01/08/2014 Reason for consult: Initial assessment   Maternal Data    Feeding Feeding Type: Breast Fed Length of feed: 25 min  LATCH Score/Interventions Latch: Grasps breast easily, tongue down, lips flanged, rhythmical sucking.  Audible Swallowing: Spontaneous and intermittent  Type of Nipple: Everted at rest and after stimulation  Comfort (Breast/Nipple): Soft / non-tender     Hold (Positioning): Assistance needed to correctly position infant at breast and maintain latch. Intervention(s): Support Pillows;Position options;Skin to skin  LATCH Score: 9  Lactation Tools Discussed/Used Pump  Review: Setup, frequency, and cleaning;Milk Storage Initiated by:: Stevan BornSherry Avrom Robarts , staff nurse to follow up with pumping Date initiated:: 01/08/14   Consult Status Consult Status: Follow-up Date: 01/08/14 Follow-up type: In-patient    Stevan BornKendrick, [name] [facility] 01/08/2014, 4:14 PM

## 2014-01-08 NOTE — Plan of Care (Signed)
Problem: Phase II Progression Outcomes Goal: Rh isoimmunization per orders Outcome: Not Applicable Date Met:  06/98/61 Goal: Other Phase II Outcomes/Goals Outcome: Not Applicable Date Met:  48/30/73

## 2014-01-08 NOTE — Progress Notes (Signed)
Patient ID: Jasmine [name], female   DOB: 06-14-1980, 33 y.o.   MRN: [id number] PPD # 1 SVD  S:  Reports feeling well, but sore perineum             Tolerating po/ No nausea or vomiting             Bleeding is light             Pain controlled with narcotic analgesics including Percocet - NO NSAIDS             Up ad lib / ambulatory / voiding without difficulties    Newborn  Information for the patient's newborn:  Achille RichCole, Girl Kati [[id number]][[id number]]  female  breast feeding    O:  A & O x 3, in no apparent distress              VS:  Filed Vitals:   01/07/14 2130 01/07/14 2225 01/08/14 0226 01/08/14 0548  BP: 123/51 118/56 117/57 121/72  Pulse: 89 85 77 85  Temp: 98.9 F (37.2 C) 98.9 F (37.2 C) 98.5 F (36.9 C) 98.3 F (36.8 C)  TempSrc: Oral Oral Oral   Resp: 20 20 18    Height:      Weight:      SpO2:    100%    LABS:  Recent Labs  01/07/14 1756 01/08/14 0544  WBC 11.5* 16.1*  HGB 11.6* 10.6*  HCT 34.9* 31.7*  PLT 74* 71*    Blood type: O POS (11/14 1756)  Rubella: Immune (05/18 0000)   I&O: I/O last 3 completed shifts: In: -  Out: 300 [Blood:300]             Lungs: Clear and unlabored  Heart: regular rate and rhythm / no murmurs  Abdomen: soft, non-tender, non-distended              Fundus: firm, non-tender, U-1  Perineum: 2nd degree perineal repair healing well  Lochia: minimal  Extremities: no edema, no calf pain or tenderness, no Homans    A/P: PPD # 1  33 y.o., [id number]   Principal Problem:    Postpartum care following vaginal delivery (11/14)  Active Problems:    Gestational Thrombocytopenia - decreasing platelets    Active labor    Labor and delivery indication for care or intervention    Postpartum state   Doing well - stable status  Routine post partum orders  Platelet Count in AM  Prednisone 30 mg daily  Anticipate discharge tomorrow   *Consult Dr. Billy Coastaavon re: declining Jasmine [name], Jasmine [name], M, MSN, CNM 01/08/2014, 9:50 AM

## 2014-01-08 NOTE — Plan of Care (Signed)
Problem: Phase I Progression Outcomes Goal: Pain controlled with appropriate interventions Outcome: Completed/Met Date Met:  01/08/14 Goal: Voiding adequately Outcome: Completed/Met Date Met:  01/08/14 Goal: Foley catheter patent Outcome: Not Applicable Date Met:  07/62/26 Goal: OOB as tolerated unless otherwise ordered Outcome: Completed/Met Date Met:  01/08/14 Goal: IS, TCDB as ordered Outcome: Not Applicable Date Met:  33/35/45 Goal: VS, stable, temp < 100.4 degrees F Outcome: Completed/Met Date Met:  01/08/14 Goal: Initial discharge plan identified Outcome: Completed/Met Date Met:  01/08/14 Goal: Other Phase I Outcomes/Goals Outcome: Completed/Met Date Met:  01/08/14

## 2014-01-09 ENCOUNTER — Ambulatory Visit: Payer: BC Managed Care – PPO | Admitting: Hematology and Oncology

## 2014-01-09 ENCOUNTER — Other Ambulatory Visit: Payer: BC Managed Care – PPO

## 2014-01-09 ENCOUNTER — Telehealth: Payer: Self-pay | Admitting: *Deleted

## 2014-01-09 LAB — PLATELET COUNT: PLATELETS: 79 10*3/uL — AB (ref 150–400)

## 2014-01-09 MED ORDER — PREDNISONE (PAK) 10 MG PO TABS: 20.0000 mg | ORAL_TABLET | Freq: Every morning | ORAL | Status: DC

## 2014-01-09 MED ORDER — PREDNISONE (PAK) 10 MG PO TABS: ORAL_TABLET | Freq: Every day | ORAL | Status: DC

## 2014-01-09 MED ORDER — PREDNISONE (PAK) 10 MG PO TABS: 10.0000 mg | ORAL_TABLET | ORAL | Status: DC

## 2014-01-09 MED ORDER — PREDNISONE (PAK) 10 MG PO TABS: 10.0000 mg | ORAL_TABLET | Freq: Three times a day (TID) | ORAL | Status: DC

## 2014-01-09 MED ORDER — PREDNISONE (PAK) 10 MG PO TABS: 20.0000 mg | ORAL_TABLET | Freq: Every evening | ORAL | Status: DC

## 2014-01-09 MED ORDER — PREDNISONE (PAK) 10 MG PO TABS: 10.0000 mg | ORAL_TABLET | Freq: Four times a day (QID) | ORAL | Status: DC

## 2014-01-09 MED ORDER — OXYCODONE-ACETAMINOPHEN 5-325 MG PO TABS: 1.0000 | ORAL_TABLET | ORAL | Status: DC | PRN

## 2014-01-09 NOTE — Plan of Care (Signed)
Problem: Consults Goal: Postpartum Patient Education (See Patient Education module for education specifics.)  Outcome: Completed/Met Date Met:  01/09/14 Goal: Nutrition Consult-if indicated Outcome: Not Applicable Date Met:  68/08/81 Goal: Diabetes Guidelines if Diabetic/Glucose > 140 If diabetic or lab glucose is > 140 mg/dl - Initiate Diabetes/Hyperglycemia Guidelines & Document Interventions  Outcome: Not Applicable Date Met:  12/24/57  Problem: Discharge Progression Outcomes Goal: Barriers To Progression Addressed/Resolved Outcome: Not Applicable Date Met:  45/85/92 Goal: Activity appropriate for discharge plan Outcome: Completed/Met Date Met:  01/09/14 Goal: Tolerating diet Outcome: Completed/Met Date Met:  92/44/62 Goal: Complications resolved/controlled Outcome: Not Applicable Date Met:  86/38/17 Goal: Pain controlled with appropriate interventions Outcome: Completed/Met Date Met:  01/09/14 Goal: Afebrile, VS remain stable at discharge Outcome: Completed/Met Date Met:  01/09/14 Goal: Remove staples per MD order Outcome: Not Applicable Date Met:  71/16/57 Goal: MMR given as ordered Outcome: Not Applicable Date Met:  90/38/33 Goal: Discharge plan in place and appropriate Outcome: Completed/Met Date Met:  01/09/14 Goal: Other Discharge Outcomes/Goals Outcome: Not Applicable Date Met:  38/32/91

## 2014-01-09 NOTE — Lactation Note (Signed)
This note was copied from the chart of Jasmine [name]. Lactation Consultation Note Noted baby had 9% weight loss and LPI. DEBP at bedside and mom supplementing. Encouraged to use LPI feeding scale according to age of hours after BF. Baby is cluster feeding and acts very hungry after BF, encouraged to increase supplement amounts from 7ml to 15-20 ml if no colostrum pumped. Encouraged to massage breast during BF to express colostrum during feedings. Encouraged strict.  I&O. STS encouraged to calm baby during feeding time. Reviewed LPI sheet. Patient Name: Jasmine Sherrilee Gillesiffany Suk OZHYQ'MToday's Date: 01/09/2014 Reason for consult: Follow-up assessment;Infant weight loss   Maternal Data    Feeding Feeding Type: Breast Fed  LATCH Score/Interventions                      Lactation Tools Discussed/Used Tools: Pump Breast pump type: Double-Electric Breast Pump   Consult Status Consult Status: Follow-up Date: 01/09/14 Follow-up type: In-patient    Tequia Wolman, Diamond NickelLAURA G 01/09/2014, 3:24 AM

## 2014-01-09 NOTE — Plan of Care (Signed)
Problem: Consults Goal: Skin Care Protocol Initiated - if Braden Score 18 or less If consults are not indicated, leave blank or document N/A  Outcome: Not Applicable Date Met:  04/24/29

## 2014-01-09 NOTE — Telephone Encounter (Signed)
Pt notified of below- verbalized understanding

## 2014-01-09 NOTE — Lactation Note (Signed)
This note was copied from the chart of Jasmine Sherrilee Gillesiffany Harjo. Lactation Consultation Note             Follow up consult wiht this mom of a late pre term baby, now 2337 hours old, and at 9% weight loss. The baby is being followed by Dr. Dareen PianoAnderson. Mom is breast feeding and baby and mom doing well. Mom rented a DEP to protect her supply and to provide EBM for the baby. Mom knows to call lactation for questions/concerns.   Patient Name: Jasmine [name] VOZDG'UToday's Date: 01/09/2014 Reason for consult: Follow-up assessment   Maternal Data    Feeding Feeding Type: Breast Fed Length of feed: 20 min  LATCH Score/Interventions Latch: Grasps breast easily, tongue down, lips flanged, rhythmical sucking.  Audible Swallowing: A few with stimulation Intervention(s): Hand expression;Skin to skin  Type of Nipple: Everted at rest and after stimulation  Comfort (Breast/Nipple): Soft / non-tender     Hold (Positioning): No assistance needed to correctly position infant at breast.  LATCH Score: 9  Lactation Tools Discussed/Used Pump Review: Setup, frequency, and cleaning;Milk Storage   Consult Status Consult Status: Complete Follow-up type: Call as needed    Alfred LevinsLee, Iana Buzan Anne 01/09/2014, 9:15 AM

## 2014-01-09 NOTE — Telephone Encounter (Signed)
-----   Message from Artis DelayNi Gorsuch, MD sent at 01/09/2014  1:31 PM EST ----- Regarding: instructions I recommend stopping prednisone now and recheck with OB or PCP in 6 weeks. If platelets is still low, i will see her back. OK to cancel appt today

## 2014-01-09 NOTE — Discharge Summary (Signed)
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 Intrapartum Procedures: spontaneous vaginal delivery Postpartum Procedures: none Complications-Operative and Postpartum: 2nd degree perineal laceration HEMOGLOBIN  Date Value Ref Range Status  01/08/2014 10.6* 12.0 - 15.0 g/dL Final   HGB  Date Value Ref Range Status  01/02/2014 11.0* 11.6 - 15.9 g/dL Final   HCT  Date Value Ref Range Status  01/08/2014 31.7* 36.0 - 46.0 % Final  01/02/2014 34.9 34.8 - 46.6 % Final    Physical Exam:  General: alert and cooperative Lochia: appropriate Uterine Fundus: firm Incision: healing well, no significant drainage, no dehiscence, no significant erythema DVT Evaluation: No evidence of DVT seen on physical exam. Negative Homan's sign. No cords or calf tenderness. No significant calf/ankle edema.  Discharge Diagnoses: Term Pregnancy-delivered, Thrombocytopenia  Discharge Information: Date: 01/09/2014 Activity: pelvic rest Diet: routine Medications: PNV, Percocet and Prednisone taper pak Condition: stable Instructions: refer to practice specific booklet Discharge to: home Follow-up Information    [facility]. Schedule an appointment as soon as possible for a visit in 2 weeks.   Specialty:  Hematology and Oncology   Contact information:   [address] [place] [id number] [phone]       Follow up with Lenoard AdenAAVON,RICHARD J, MD. Schedule an appointment as soon as possible for a visit in 6 weeks.   Specialty:  Obstetrics and Gynecology   Contact information:   [address] [place] [phone]       Newborn Data: Live born female on 01/07/14 Birth Weight: 6 lb 1.5 oz (2764 g) APGAR: 9, 9  Home with mother.  Ottilie Wigglesworth, N 01/09/2014, 11:30 AM

## 2014-01-09 NOTE — Progress Notes (Addendum)
PPD #2- SVD  Subjective:   Reports feeling well Tolerating po/ No nausea or vomiting Bleeding is light Pain controlled with Percocet Up ad lib / ambulatory / voiding without problems Newborn: breast and bottle feeding     Objective:   VS: VS:  Filed Vitals:   01/08/14 0548 01/08/14 1040 01/08/14 1736 01/09/14 0600  BP: 121/72 100/42 115/52 121/67  Pulse: 85 66 81 80  Temp: 98.3 F (36.8 C) 98.3 F (36.8 C) 98.4 F (36.9 C) 98.6 F (37 C)  TempSrc:  Oral Oral   Resp:  18 18 18   Height:      Weight:      SpO2: 100%  99%     LABS:  Recent Labs  01/07/14 1756 01/08/14 0544 01/09/14 0610  WBC 11.5* 16.1*  --   HGB 11.6* 10.6*  --   PLT 74* 71* 79*   Blood type: --/--/O POS, O POS (11/14 1756) Rubella: Immune (05/18 0000)                I&O: Intake/Output      11/15 0701 - 11/16 0700 11/16 0701 - 11/17 0700   Blood     Total Output       Net              Physical Exam: Alert and oriented X3 Abdomen: soft, non-tender, non-distended  Fundus: firm, non-tender, U-2 Perineum: Well approximated, no significant erythema, edema, or drainage; healing well. Lochia: small Extremities: No edema, no calf pain or tenderness    Assessment: PPD #2  [id number]/ S/P:induced vaginal Thrombocytopenia, delivered-stable Doing well - stable for discharge home  Plan: Discharge home RX's:  Percocet 5/325 1 to 2 po Q 4 hrs prn pain #30 Refill x 0 Continue Prednisone-taper over 7 days (dose pack) Routine pp visit in 6wks F/u with Hematologist in 2-4 weeks [facility] booklet given    Jasmine LarryBHAMBRI, Elysa Womac, N MSN, CNM 01/09/2014, 10:11 AM

## 2014-01-16 ENCOUNTER — Other Ambulatory Visit: Payer: BC Managed Care – PPO

## 2014-01-23 ENCOUNTER — Other Ambulatory Visit: Payer: BC Managed Care – PPO

## 2015-01-16 ENCOUNTER — Encounter: Payer: Self-pay | Admitting: Family Medicine

## 2015-01-16 ENCOUNTER — Ambulatory Visit (INDEPENDENT_AMBULATORY_CARE_PROVIDER_SITE_OTHER): Payer: BC Managed Care – PPO | Admitting: Family Medicine

## 2015-01-16 VITALS — BP 110/82 | HR 99 | Temp 98.6°F | Ht 67.0 in | Wt 207.7 lb

## 2015-01-16 DIAGNOSIS — J4521 Mild intermittent asthma with (acute) exacerbation: Secondary | ICD-10-CM

## 2015-01-16 DIAGNOSIS — J069 Acute upper respiratory infection, unspecified: Secondary | ICD-10-CM | POA: Diagnosis not present

## 2015-01-16 MED ORDER — PREDNISONE 10 MG PO TABS
ORAL_TABLET | ORAL | Status: DC
Start: 2015-01-16 — End: 2015-02-02

## 2015-01-16 MED ORDER — ALBUTEROL SULFATE HFA 108 (90 BASE) MCG/ACT IN AERS: 2.0000 | INHALATION_SPRAY | Freq: Three times a day (TID) | RESPIRATORY_TRACT | Status: DC | PRN

## 2015-01-16 NOTE — Patient Instructions (Signed)
Take the medication as advised. Seek care immediately if worsening, trouble breathing or other concerns.  INSTRUCTIONS FOR UPPER RESPIRATORY INFECTION:  -plenty of rest and fluids  -nasal saline wash 2-3 times daily (use prepackaged nasal saline or bottled/distilled water if making your own)   -can use AFRIN nasal spray for drainage and nasal congestion - but do NOT use longer then 3-4 days  -can use tylenol (in no history of liver disease) or ibuprofen (if no history of kidney disease, bowel bleeding or significant heart disease) as directed for aches and sorethroat  -in the winter time, using a humidifier at night is helpful (please follow cleaning instructions)  -if you are taking a cough medication - use only as directed, may also try a teaspoon of honey to coat the throat and throat lozenges. If given a cough medication with codeine or hydrocodone or other narcotic please be advised that this contains a strong and  potentially addicting medication. Please follow instructions carefully, take as little as possible and only use AS NEEDED for severe cough. Discuss potential side effects with your pharmacy. Please do not drive or operate machinery while taking these types of medications. Please do not take other sedating medications, drugs or alcohol while taking this medication without discussing with your doctor.  -for sore throat, salt water gargles can help  -follow up if you have fevers, facial pain, tooth pain, difficulty breathing or are worsening or symptoms persist longer then expected  Upper Respiratory Infection, Adult An upper respiratory infection (URI) is also known as the common cold. It is often caused by a type of germ (virus). Colds are easily spread (contagious). You can pass it to others by kissing, coughing, sneezing, or drinking out of the same glass. Usually, you get better in 1 to 3  weeks.  However, the cough can last for even longer. HOME CARE   Only take medicine as  told by your doctor. Follow instructions provided above.  Drink enough water and fluids to keep your pee (urine) clear or pale yellow.  Get plenty of rest.  Return to work when your temperature is < 100 for 24 hours or as told by your doctor. You may use a face mask and wash your hands to stop your cold from spreading. GET HELP RIGHT AWAY IF:   After the first few days, you feel you are getting worse.  You have questions about your medicine.  You have chills, shortness of breath, or red spit (mucus).  You have pain in the face for more then 1-2 days, especially when you bend forward.  You have a fever, puffy (swollen) neck, pain when you swallow, or white spots in the back of your throat.  You have a bad headache, ear pain, sinus pain, or chest pain.  You have a high-pitched whistling sound when you breathe in and out (wheezing).  You cough up blood.  You have sore muscles or a stiff neck. MAKE SURE YOU:   Understand these instructions.  Will watch your condition.  Will get help right away if you are not doing well or get worse. Document Released: 07/30/2007 Document Revised: 05/05/2011 Document Reviewed: 05/18/2013 [facility] Patient Information 2015 [place], [place]. This information is not intended to replace advice given to you by your health care provider. Make sure you discuss any questions you have with your health care provider.

## 2015-01-16 NOTE — Progress Notes (Signed)
HPI:   Jasmine [name] is a 34 yo F, currently weaning from breastfeeding, with a PMH Asthma here for an acute visit for:  Cough: -started: 3 days ago -symptoms:nasal congestion, sore throat, cough, PND, wheeze last night and today - used albuterol which helped a little -denies:fever, SOB, NVD, tooth pain -has tried: albuterol -sick contacts/travel/risks: denies flu exposure, tick exposure or or Ebola risks -Hx of: allergies and asthma - reports usually require prednisone if gets a cold, but is fine in terms of her asthma otherwise, wants refill on her albuterol  ROS: See pertinent positives and negatives per HPI.  Past Medical History  Diagnosis Date  . Allergic rhinitis   . Asthma   . Thrombocytopenia (HCC) 12/19/2013  . Active labor 01/07/2014  . Postpartum care following vaginal delivery (11/14) 01/08/2014    Past Surgical History  Procedure Laterality Date  . Tubal resection      No family history on file.  Social History   Social History  . Marital Status: Married    Spouse Name: N/A  . Number of Children: N/A  . Years of Education: N/A   Social History Main Topics  . Smoking status: Never Smoker   . Smokeless tobacco: Never Used  . Alcohol Use: No  . Drug Use: No  . Sexual Activity: Not Asked   Other Topics Concern  . None   Social History Narrative     Current outpatient prescriptions:  .  albuterol (PROAIR HFA) 108 (90 BASE) MCG/ACT inhaler, Inhale 2 puffs into the lungs 3 (three) times daily as needed. 1-2 puffs three times a day as needed, Disp: 1 Inhaler, Rfl: 0 .  predniSONE (DELTASONE) 10 MG tablet,  (4 tabs) daily for 1 day, then  (3 tabs) daily for 2 days, then  (2 tabs) daily for 2 days, then (1 tab) daily for 2 days, Disp: 16 tablet, Rfl: 0  EXAM:  Filed Vitals:   01/16/15 1543  BP: 110/82  Pulse: 99  Temp: 98.6 F (37 C)    Body mass index is 32.52 kg/(m^2).  GENERAL: vitals reviewed and listed above, alert,  oriented, appears well hydrated and in no acute distress  HEENT: atraumatic, conjunttiva clear, no obvious abnormalities on inspection of external nose and ears, normal appearance of ear canals and TMs, clear nasal congestion, mild post oropharyngeal erythema with PND, no tonsillar edema or exudate, no sinus TTP  NECK: no obvious masses on inspection  LUNGS: clear to auscultation bilaterally, no wheezes, rales or rhonchi, good air movement  CV: HRRR, no peripheral edema  MS: moves all extremities without noticeable abnormality  PSYCH: pleasant and cooperative, no obvious depression or anxiety  ASSESSMENT AND PLAN:  Discussed the following assessment and plan:  Acute upper respiratory infection  Asthma, mild intermittent, with acute exacerbation  -given HPI and exam findings today, a serious infection or illness is unlikely. We discussed potential etiologies, with VURI being most likely, and opted for prednisone and supportive care and monitoring. We discussed treatment side effects, likely course, antibiotic misuse, transmission, and signs of developing a serious illness. -of course, we advised to return or notify a doctor immediately if symptoms worsen or persist or new concerns arise.    Patient Instructions  Take the medication as advised. Seek care immediately if worsening, trouble breathing or other concerns.  INSTRUCTIONS FOR UPPER RESPIRATORY INFECTION:  -plenty of rest and fluids  -nasal saline wash 2-3 times daily (use prepackaged nasal saline or bottled/distilled water if making  your own)   -can use AFRIN nasal spray for drainage and nasal congestion - but do NOT use longer then 3-4 days  -can use tylenol (in no history of liver disease) or ibuprofen (if no history of kidney disease, bowel bleeding or significant heart disease) as directed for aches and sorethroat  -in the winter time, using a humidifier at night is helpful (please follow cleaning  instructions)  -if you are taking a cough medication - use only as directed, may also try a teaspoon of honey to coat the throat and throat lozenges. If given a cough medication with codeine or hydrocodone or other narcotic please be advised that this contains a strong and  potentially addicting medication. Please follow instructions carefully, take as little as possible and only use AS NEEDED for severe cough. Discuss potential side effects with your pharmacy. Please do not drive or operate machinery while taking these types of medications. Please do not take other sedating medications, drugs or alcohol while taking this medication without discussing with your doctor.  -for sore throat, salt water gargles can help  -follow up if you have fevers, facial pain, tooth pain, difficulty breathing or are worsening or symptoms persist longer then expected  Upper Respiratory Infection, Adult An upper respiratory infection (URI) is also known as the common cold. It is often caused by a type of germ (virus). Colds are easily spread (contagious). You can pass it to others by kissing, coughing, sneezing, or drinking out of the same glass. Usually, you get better in 1 to 3  weeks.  However, the cough can last for even longer. HOME CARE   Only take medicine as told by your doctor. Follow instructions provided above.  Drink enough water and fluids to keep your pee (urine) clear or pale yellow.  Get plenty of rest.  Return to work when your temperature is < 100 for 24 hours or as told by your doctor. You may use a face mask and wash your hands to stop your cold from spreading. GET HELP RIGHT AWAY IF:   After the first few days, you feel you are getting worse.  You have questions about your medicine.  You have chills, shortness of breath, or red spit (mucus).  You have pain in the face for more then 1-2 days, especially when you bend forward.  You have a fever, puffy (swollen) neck, pain when you  swallow, or white spots in the back of your throat.  You have a bad headache, ear pain, sinus pain, or chest pain.  You have a high-pitched whistling sound when you breathe in and out (wheezing).  You cough up blood.  You have sore muscles or a stiff neck. MAKE SURE YOU:   Understand these instructions.  Will watch your condition.  Will get help right away if you are not doing well or get worse. Document Released: 07/30/2007 Document Revised: 05/05/2011 Document Reviewed: 05/18/2013 [facility] Patient Information 2015 [place], [place]. This information is not intended to replace advice given to you by your health care provider. Make sure you discuss any questions you have with your health care provider.     Kriste BasqueKIM, HANNAH R.

## 2015-01-16 NOTE — Progress Notes (Signed)
Pre visit review using our clinic review tool, if applicable. No additional management support is needed unless otherwise documented below in the visit note. 

## 2015-01-31 ENCOUNTER — Telehealth: Payer: Self-pay | Admitting: Family Medicine

## 2015-01-31 NOTE — Telephone Encounter (Signed)
Pt saw dr Selena Battenkim on 01/16/15 and she is still having cough. Pt is breast feeding. [facility] [address]. Please advise

## 2015-02-01 NOTE — Telephone Encounter (Signed)
I called the pt and scheduled an appt for tomorrow with Dr Durene CalHunter at 11:30am.

## 2015-02-01 NOTE — Telephone Encounter (Signed)
Advise appt as this was some time ago.

## 2015-02-02 ENCOUNTER — Encounter: Payer: Self-pay | Admitting: Family Medicine

## 2015-02-02 ENCOUNTER — Ambulatory Visit (INDEPENDENT_AMBULATORY_CARE_PROVIDER_SITE_OTHER): Payer: BC Managed Care – PPO | Admitting: Family Medicine

## 2015-02-02 VITALS — BP 140/70 | HR 82 | Temp 98.3°F | Wt 203.0 lb

## 2015-02-02 DIAGNOSIS — Z23 Encounter for immunization: Secondary | ICD-10-CM

## 2015-02-02 DIAGNOSIS — J4520 Mild intermittent asthma, uncomplicated: Secondary | ICD-10-CM

## 2015-02-02 DIAGNOSIS — J4521 Mild intermittent asthma with (acute) exacerbation: Secondary | ICD-10-CM | POA: Diagnosis not present

## 2015-02-02 MED ORDER — PREDNISONE 20 MG PO TABS: ORAL_TABLET | ORAL | Status: DC

## 2015-02-02 NOTE — Progress Notes (Signed)
PCP: Evette GeorgesDD,JEFFREY ALLEN, MD  Subjective:  Jasmine [name] is a 34 y.o. year old very pleasant female patient who presents with asthma exacerbation/Upper Respiratory infection symptoms including nasal congestion, sore throat, cough. URI symptoms have largely improved after seeing Dr. Selena BattenKim about 2 weeks ago and using 7 day prednisone taper. After taper ended, patient had recurrence of shortness of breath coupled with wheeze and cough intermittently controlled with albuterol. Patient usually has this about once a year and does better with more prolonged prednisone taper -started: > 3 weeks ago - symptoms are worsening after initial improvement -previous treatments:  Time, rest, albuterol, prednisone taper -sick contacts/travel/risks: denies flu exposure.  -Hx of: allergies currently untreated  ROS-denies fever, NVD, tooth pain  Pertinent Past Medical History- asthma, allergic rhinitis  Medications- reviewed  Current Outpatient Prescriptions  Medication Sig Dispense Refill  . albuterol (PROAIR HFA) 108 (90 BASE) MCG/ACT inhaler Inhale 2 puffs into the lungs 3 (three) times daily as needed. 1-2 puffs three times a day as needed (Patient not taking: Reported on 02/02/2015) 1 Inhaler 0  . predniSONE (DELTASONE) 20 MG tablet 2 tabs x 4days, 1 tab x3 days, a half a tab x3 days, then half a tablet Monday Wednesday Friday for a two-week taper 20 tablet 0   No current facility-administered medications for this visit.    Objective: BP 140/70 mmHg  Pulse 82  Temp(Src) 98.3 F (36.8 C)  Wt 203 lb (92.08 kg)  SpO2 98% Gen: NAD, resting comfortably HEENT: Turbinates erythematous, TM normal, pharynx largely normal, no sinus tenderness CV: RRR no murmurs rubs or gallops Lungs: CTAB (but has albuterol before visit) no crackles, wheeze, rhonchi Abdomen: soft/nontender/nondistended/normal bowel sounds. No rebound or guarding.  Ext: no edema Skin: warm, dry, no rash Neuro: grossly normal, moves all  extremities  Assessment/Plan:  Upper Respiratory infection now resolved but leading to asthma exacerbation Doing well on prednisone 7 days but symptoms of wheeze, shortness of breath, cough returned off taper. We will send in 2 week taper. Patient to return if recurrence. ? Allergies underlying needing to be treated but shouldn't cause shortness of breath or wheeze.   Symptomatic treatment with: albuterol prn while on prednisone taper  Finally, we reviewed reasons to return to care including if symptoms worsen or persist or new concerns arise.  Meds ordered this encounter  Medications  . predniSONE (DELTASONE) 20 MG tablet    Sig: 2 tabs x 4days, 1 tab x3 days, a half a tab x3 days, then half a tablet Monday Wednesday Friday for a two-week taper    Dispense:  20 tablet    Refill:  0

## 2015-02-02 NOTE — Patient Instructions (Addendum)
Flu shot received today.  Asthma exacerbation Treat with longer prednisone taper this time If this doesn't clear it up see us back

## 2016-03-18 ENCOUNTER — Other Ambulatory Visit: Payer: Self-pay | Admitting: Family Medicine

## 2016-11-14 ENCOUNTER — Encounter: Payer: Self-pay | Admitting: Family Medicine

## 2016-11-17 ENCOUNTER — Ambulatory Visit (INDEPENDENT_AMBULATORY_CARE_PROVIDER_SITE_OTHER): Payer: BC Managed Care – PPO | Admitting: Family Medicine

## 2016-11-17 ENCOUNTER — Encounter: Payer: Self-pay | Admitting: Family Medicine

## 2016-11-17 DIAGNOSIS — M25519 Pain in unspecified shoulder: Secondary | ICD-10-CM

## 2016-11-17 DIAGNOSIS — S161XXS Strain of muscle, fascia and tendon at neck level, sequela: Secondary | ICD-10-CM
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
## 2016-11-17 NOTE — Patient Instructions (Signed)
Motrin 400 mg twice daily with food  Ice packs to your neck and shoulder and back in the evening when necessary  You'll be sore for the next 4-6 weeks  Okay to your normal activities no restrictions

## 2017-07-27 ENCOUNTER — Encounter: Payer: Self-pay | Admitting: Family Medicine

## 2017-07-27 ENCOUNTER — Ambulatory Visit: Payer: BC Managed Care – PPO | Admitting: Family Medicine

## 2017-07-27 VITALS — BP 118/70 | HR 74 | Temp 98.4°F | Resp 12 | Ht 67.0 in | Wt 196.4 lb

## 2017-07-27 DIAGNOSIS — R05 Cough: Secondary | ICD-10-CM | POA: Diagnosis not present

## 2017-07-27 DIAGNOSIS — J301 Allergic rhinitis due to pollen: Secondary | ICD-10-CM | POA: Diagnosis not present

## 2017-07-27 DIAGNOSIS — J45909 Unspecified asthma, uncomplicated: Secondary | ICD-10-CM | POA: Diagnosis not present

## 2017-07-27 DIAGNOSIS — R059 Cough, unspecified: Secondary | ICD-10-CM

## 2017-07-27 MED ORDER — BENZONATATE 100 MG PO CAPS: 200.0000 mg | ORAL_CAPSULE | Freq: Two times a day (BID) | ORAL | 0 refills | Status: AC | PRN

## 2017-07-27 MED ORDER — FLUTICASONE PROPIONATE 50 MCG/ACT NA SUSP: 2.0000 | Freq: Every day | NASAL | 3 refills | Status: DC

## 2017-07-27 MED ORDER — ALBUTEROL SULFATE HFA 108 (90 BASE) MCG/ACT IN AERS: 2.0000 | INHALATION_SPRAY | RESPIRATORY_TRACT | 2 refills | Status: DC | PRN

## 2017-07-27 NOTE — Progress Notes (Signed)
ACUTE VISIT  HPI:  Chief Complaint  Patient presents with  . Dry cough    [name] is a 37 y.o.female here today complaining of 6 to 8 weeks of nonproductive cough. She has had postnasal drainage, nasal congestion, and rhinorrhea intermittently.   Cough seems to be exacerbated by laying down. No alleviating factors identified.   She has history of allergic rhinitis and asthma. She has not used Albuterol in months, she needs a refill. She takes Benadryl 25 mg daily as needed, which helps with symptoms but makes her drowsy.  No associated GI symptoms.   Cough  This is a new problem. The current episode started more than 1 month ago. The problem has been waxing and waning. The cough is non-productive. Associated symptoms include nasal congestion, postnasal drip and rhinorrhea. Pertinent negatives include no chest pain, ear pain, eye redness, fever, headaches, heartburn, hemoptysis, myalgias, rash, sore throat, shortness of breath, sweats, weight loss or wheezing. The symptoms are aggravated by lying down. Her past medical history is significant for asthma and environmental allergies.    No history of tobacco use.  No Hx of recent travel. No sick contact. No known insect bite.   Symptoms otherwise stable.  LMP 7 months ago, she is on continuous OCPs.   Review of Systems  Constitutional: Negative for activity change, appetite change, fatigue, fever and weight loss.  HENT: Positive for congestion, postnasal drip and rhinorrhea. Negative for ear pain, mouth sores, sinus pressure, sore throat and voice change.   Eyes: Negative for discharge and redness.  Respiratory: Positive for cough and chest tightness. Negative for hemoptysis, shortness of breath and wheezing.   Cardiovascular: Negative for chest pain.  Gastrointestinal: Negative for abdominal pain, diarrhea, heartburn, nausea and vomiting.  Musculoskeletal: Negative for myalgias and neck pain.  Skin:  Negative for rash.  Allergic/Immunologic: Positive for environmental allergies.  Neurological: Negative for weakness and headaches.  Hematological: Negative for adenopathy. Does not bruise/bleed easily.      Current Outpatient Medications on File Prior to Visit  Medication Sig Dispense Refill  . KARIVA 0.15-0.02/0.01 MG (21/5) tablet Take 1 tablet by mouth daily.  11  . phentermine (ADIPEX-P) 37.5 MG tablet Take 37.5 mg by mouth every morning.   2   No current facility-administered medications on file prior to visit.      Past Medical History:  Diagnosis Date  . Active labor 01/07/2014  . Allergic rhinitis   . Asthma   . Postpartum care following vaginal delivery (11/14) 01/08/2014  . Thrombocytopenia (HCC) 12/19/2013   No Known Allergies  Social History   Socioeconomic History  . Marital status: Married    Spouse name: Not on file  . Number of children: Not on file  . Years of education: Not on file  . Highest education level: Not on file  Occupational History  . Not on file  Social Needs  . Financial resource strain: Not on file  . Food insecurity:    Worry: Not on file    Inability: Not on file  . Transportation needs:    Medical: Not on file    Non-medical: Not on file  Tobacco Use  . Smoking status: Never Smoker  . Smokeless tobacco: Never Used  Substance and Sexual Activity  . Alcohol use: No  . Drug use: No  . Sexual activity: Not on file  Lifestyle  . Physical activity:    Days per week: Not on file  Minutes per session: Not on file  . Stress: Not on file  Relationships  . Social connections:    Talks on phone: Not on file    Gets together: Not on file    Attends religious service: Not on file    Active member of club or organization: Not on file    Attends meetings of clubs or organizations: Not on file    Relationship status: Not on file  Other Topics Concern  . Not on file  Social History Narrative  . Not on file    Vitals:    07/27/17 1559  BP: 118/70  Pulse: 74  Resp: 12  Temp: 98.4 F (36.9 C)  SpO2: 100%   Body mass index is 30.76 kg/m.   Physical Exam  Nursing note and vitals reviewed. Constitutional: She is oriented to person, place, and time. She appears well-developed. She does not appear ill. No distress.  HENT:  Head: Normocephalic and atraumatic.  Nose: Septal deviation present. No rhinorrhea.  Mouth/Throat: Oropharynx is clear and moist and mucous membranes are normal.  Hypertrophic turbinates. Mild postnasal drainage.  Eyes: Pupils are equal, round, and reactive to light. Conjunctivae are normal.  Cardiovascular: Normal rate and regular rhythm.  Murmur (? soft SEM LUSB) heard. Respiratory: Effort normal and breath sounds normal. No respiratory distress.  Lymphadenopathy:    She has no cervical adenopathy.  Neurological: She is alert and oriented to person, place, and time. She has normal strength.  Skin: Skin is warm. No rash noted. No erythema.  Psychiatric: She has a normal mood and affect.  Well groomed, good eye contact.     ASSESSMENT AND PLAN:   Ms. Jasmine [name] was seen today for dry cough.  Diagnoses and all orders for this visit:  Mild asthma without complication, unspecified whether persistent  This probably could be contributing to her cough. I do not think oral steroids are needed at this time, auscultation today negative for wheezing. Albuterol inh 2 puff every 6 hours for a week then as needed for wheezing or shortness of breath.   -     albuterol (PROAIR HFA) 108 (90 Base) MCG/ACT inhaler; Inhale 2 puffs into the lungs every 4 (four) hours as needed for wheezing or shortness of breath.  Allergic rhinitis due to pollen, unspecified seasonality  Recommend OTC Zyrtec 10 mg daily. Flonase intranasal spray daily. Nasal irrigation with saline may also help.  -     fluticasone (FLONASE) 50 MCG/ACT nasal spray; Place 2 sprays into both nostrils daily.  Cough  We  discussed possible etiologies: Allergies, asthma, residual symptom after recent URI, and GERD among some. I do not think imaging is needed today. Benzonatate 100 to 200 mg twice daily may help. I recommend following with PCP in 2 weeks if still symptomatic.      Shanise Balch G. [place], MD  [facility]. [place] office.

## 2017-07-27 NOTE — Patient Instructions (Signed)
A few things to remember from today's visit:   Mild asthma without complication, unspecified whether persistent - Plan: albuterol (PROAIR HFA) 108 (90 Base) MCG/ACT inhaler  Allergic rhinitis due to pollen, unspecified seasonality - Plan: fluticasone (FLONASE) 50 MCG/ACT nasal spray  Cough  Albuterol inh 2 puff every 6 hours for a week then as needed for wheezing or shortness of breath.   Cough, Adult A cough helps to clear your throat and lungs. A cough may last only 2-3 weeks (acute), or it may last longer than 8 weeks (chronic). Many different things can cause a cough. A cough may be a sign of an illness or another medical condition. Follow these instructions at home:  Pay attention to any changes in your cough.  Take medicines only as told by your doctor. ? If you were prescribed an antibiotic medicine, take it as told by your doctor. Do not stop taking it even if you start to feel better. ? Talk with your doctor before you try using a cough medicine.  Drink enough fluid to keep your pee (urine) clear or pale yellow.  If the air is dry, use a cold steam vaporizer or humidifier in your home.  Stay away from things that make you cough at work or at home.  If your cough is worse at night, try using extra pillows to raise your head up higher while you sleep.  Do not smoke, and try not to be around smoke. If you need help quitting, ask your doctor.  Do not have caffeine.  Do not drink alcohol.  Rest as needed. Contact a doctor if:  You have new problems (symptoms).  You cough up yellow fluid (pus).  Your cough does not get better after 2-3 weeks, or your cough gets worse.  Medicine does not help your cough and you are not sleeping well.  You have pain that gets worse or pain that is not helped with medicine.  You have a fever.  You are losing weight and you do not know why.  You have night sweats. Get help right away if:  You cough up blood.  You have trouble  breathing.  Your heartbeat is very fast. This information is not intended to replace advice given to you by your health care provider. Make sure you discuss any questions you have with your health care provider. Document Released: 10/24/2010 Document Revised: 07/19/2015 Document Reviewed: 04/19/2014 Elsevier Interactive Patient Education  2018 [employer]  Please be sure medication list is accurate. If a new problem present, please set up appointment sooner than planned today.

## 2017-10-22 ENCOUNTER — Other Ambulatory Visit: Payer: Self-pay | Admitting: Family Medicine

## 2017-10-22 DIAGNOSIS — J301 Allergic rhinitis due to pollen: Secondary | ICD-10-CM

## 2018-02-01 ENCOUNTER — Other Ambulatory Visit: Payer: Self-pay

## 2018-02-01 ENCOUNTER — Emergency Department (HOSPITAL_COMMUNITY)
Admission: EM | Admit: 2018-02-01 | Discharge: 2018-02-01 | Disposition: A | Discharge status: Home / Self Care | Payer: BC Managed Care – PPO | Attending: Emergency Medicine | Admitting: Emergency Medicine

## 2018-02-01 ENCOUNTER — Emergency Department (HOSPITAL_COMMUNITY): Payer: BC Managed Care – PPO

## 2018-02-01 ENCOUNTER — Encounter (HOSPITAL_COMMUNITY): Payer: Self-pay | Admitting: Emergency Medicine

## 2018-02-01 DIAGNOSIS — R252 Cramp and spasm: Secondary | ICD-10-CM | POA: Diagnosis not present

## 2018-02-01 DIAGNOSIS — Z79899 Other long term (current) drug therapy: Secondary | ICD-10-CM | POA: Insufficient documentation

## 2018-02-01 DIAGNOSIS — G8918 Other acute postprocedural pain: Secondary | ICD-10-CM | POA: Diagnosis present

## 2018-02-01 DIAGNOSIS — K0889 Other specified disorders of teeth and supporting structures: Secondary | ICD-10-CM | POA: Diagnosis not present

## 2018-02-01 DIAGNOSIS — Z98818 Other dental procedure status: Secondary | ICD-10-CM

## 2018-02-01 LAB — GROUP A STREP BY PCR: Group A Strep by PCR: NOT DETECTED

## 2018-02-01 LAB — CBC WITH DIFFERENTIAL/PLATELET
Abs Immature Granulocytes: 0.01 10*3/uL (ref 0.00–0.07)
Basophils Absolute: 0 10*3/uL (ref 0.0–0.1)
Basophils Relative: 1 %
Eosinophils Absolute: 0.1 10*3/uL (ref 0.0–0.5)
Eosinophils Relative: 2 %
HEMATOCRIT: 42.6 % (ref 36.0–46.0)
Hemoglobin: 12.9 g/dL (ref 12.0–15.0)
IMMATURE GRANULOCYTES: 0 %
LYMPHS ABS: 2 10*3/uL (ref 0.7–4.0)
Lymphocytes Relative: 33 %
MCH: 27.9 pg (ref 26.0–34.0)
MCHC: 30.3 g/dL (ref 30.0–36.0)
MCV: 92 fL (ref 80.0–100.0)
Monocytes Absolute: 0.6 10*3/uL (ref 0.1–1.0)
Monocytes Relative: 9 %
NEUTROS PCT: 55 %
NRBC: 0 % (ref 0.0–0.2)
Neutro Abs: 3.4 10*3/uL (ref 1.7–7.7)
Platelets: 179 10*3/uL (ref 150–400)
RBC: 4.63 MIL/uL (ref 3.87–5.11)
RDW: 13 % (ref 11.5–15.5)
WBC: 6.1 10*3/uL (ref 4.0–10.5)

## 2018-02-01 LAB — I-STAT CHEM 8, ED
BUN: 12 mg/dL (ref 6–20)
CALCIUM ION: 1.12 mmol/L — AB (ref 1.15–1.40)
CHLORIDE: 106 mmol/L (ref 98–111)
Creatinine, Ser: 0.8 mg/dL (ref 0.44–1.00)
GLUCOSE: 89 mg/dL (ref 70–99)
HCT: 42 % (ref 36.0–46.0)
Hemoglobin: 14.3 g/dL (ref 12.0–15.0)
Potassium: 3.5 mmol/L (ref 3.5–5.1)
Sodium: 140 mmol/L (ref 135–145)
TCO2: 27 mmol/L (ref 22–32)

## 2018-02-01 LAB — BASIC METABOLIC PANEL
Anion gap: 10 (ref 5–15)
BUN: 11 mg/dL (ref 6–20)
CO2: 24 mmol/L (ref 22–32)
Calcium: 8.9 mg/dL (ref 8.9–10.3)
Chloride: 105 mmol/L (ref 98–111)
Creatinine, Ser: 0.84 mg/dL (ref 0.44–1.00)
GFR calc Af Amer: >60 mL/min (ref >60)
GFR calc non Af Amer: >60 mL/min (ref >60)
GLUCOSE: 91 mg/dL (ref 70–99)
POTASSIUM: 3.6 mmol/L (ref 3.5–5.1)
Sodium: 139 mmol/L (ref 135–145)

## 2018-02-01 LAB — I-STAT BETA HCG BLOOD, ED (MC, WL, AP ONLY): I-stat hCG, quantitative: <5 m[IU]/mL (ref <5)

## 2018-02-01 MED ORDER — IOHEXOL 300 MG/ML  SOLN
75.0000 mL | Freq: Once | INTRAMUSCULAR | Status: AC | PRN
  Administered 2018-02-01: 75 mL via INTRAVENOUS

## 2018-02-01 MED ORDER — CLINDAMYCIN HCL 150 MG PO CAPS: 300.0000 mg | ORAL_CAPSULE | Freq: Three times a day (TID) | ORAL | 0 refills | Status: AC

## 2018-02-01 MED ORDER — KETOROLAC TROMETHAMINE 15 MG/ML IJ SOLN
15.0000 mg | Freq: Once | INTRAMUSCULAR | Status: AC
  Administered 2018-02-01: 15 mg via INTRAVENOUS
  Filled 2018-02-01: qty 1

## 2018-02-01 MED ORDER — DEXAMETHASONE SODIUM PHOSPHATE 10 MG/ML IJ SOLN
10.0000 mg | Freq: Once | INTRAMUSCULAR | Status: AC
  Administered 2018-02-01: 10 mg via INTRAVENOUS
  Filled 2018-02-01: qty 1

## 2018-02-01 MED ORDER — PREDNISONE 20 MG PO TABS: 40.0000 mg | ORAL_TABLET | Freq: Every day | ORAL | 0 refills | Status: AC

## 2018-02-01 NOTE — ED Notes (Signed)
Patient transported to CT 

## 2018-02-01 NOTE — ED Notes (Signed)
Patient verbalizes understanding of discharge instructions. Opportunity for questioning and answers were provided. Armband removed by staff, pt discharged from ED ambulatory.   

## 2018-02-01 NOTE — ED Notes (Signed)
Did patient vitals patient is resting with call bell in reach

## 2018-02-01 NOTE — ED Triage Notes (Signed)
2 teeth  pulled nov 6,  One wisdom got worse saw her dentist on Friday and placed on antibiotics but she was sent today states her throat is sore  And feels  Hurts to swollow , her daughter  Has been sick  Cannot  Open her mouth

## 2018-02-01 NOTE — Discharge Instructions (Addendum)
Take antibiotics as prescribed.  Take the entire course, even if your symptoms improve. Take prednisone daily starting tomorrow.  Have caution while taking this medicine, as when combined with anti-inflammatory such as Advil or Aleve, can cause increase stomach upset.  If you are having pain, use Tylenol/acetaminophen. Use ice packs to help with pain and swelling. If symptoms are not improving in the neck several days, follow-up with your dentist for further evaluation. Return to the emergency room if you develop high fevers, difficulty breathing, difficulty swallowing, or any new or concerning symptoms.
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
## 2018-09-28 ENCOUNTER — Other Ambulatory Visit: Payer: Self-pay | Admitting: Family Medicine

## 2018-09-28 DIAGNOSIS — J45909 Unspecified asthma, uncomplicated: Secondary | ICD-10-CM

## 2019-01-31 ENCOUNTER — Other Ambulatory Visit: Payer: Self-pay

## 2019-01-31 DIAGNOSIS — Z20822 Contact with and (suspected) exposure to covid-19: Secondary | ICD-10-CM

## 2019-02-02 LAB — NOVEL CORONAVIRUS, NAA: SARS-CoV-2, NAA: NOT DETECTED

## 2019-04-25 DIAGNOSIS — K859 Acute pancreatitis without necrosis or infection, unspecified: Secondary | ICD-10-CM

## 2019-04-25 HISTORY — DX: Acute pancreatitis without necrosis or infection, unspecified: K85.90

## 2019-05-09 ENCOUNTER — Other Ambulatory Visit: Payer: Self-pay

## 2019-05-09 ENCOUNTER — Ambulatory Visit (HOSPITAL_COMMUNITY)
Admission: EM | Admit: 2019-05-09 | Discharge: 2019-05-09 | Disposition: A | Discharge status: Home / Self Care | Payer: BC Managed Care – PPO | Attending: Internal Medicine | Admitting: Internal Medicine

## 2019-05-09 ENCOUNTER — Encounter (HOSPITAL_COMMUNITY): Payer: Self-pay

## 2019-05-09 DIAGNOSIS — K2100 Gastro-esophageal reflux disease with esophagitis, without bleeding: Secondary | ICD-10-CM

## 2019-05-09 DIAGNOSIS — R079 Chest pain, unspecified: Secondary | ICD-10-CM | POA: Diagnosis not present

## 2019-05-09 MED ORDER — OMEPRAZOLE 20 MG PO CPDR: 20.0000 mg | DELAYED_RELEASE_CAPSULE | Freq: Every day | ORAL | 0 refills | Status: DC

## 2019-05-09 NOTE — ED Triage Notes (Signed)
Pt states 2 wks ago she had midsternal chest pain w/nausea. The midsternal chest pain started again yesterday, but denies nausea or any other associated symptoms. Pt states the pain is 7/10 and it's heaviness. Pt is breathing normal and skin is dry and WNL.
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
## 2019-05-09 NOTE — Discharge Instructions (Signed)
You have been experiencing chest pain and burning.   I have sent in omeprazole for you to take daily to help with reflux.  Your EKG was negative for a heart attack in our office today.  If you  are not feeling better in 2 weeks, you may call to follow up with GI.  Go to the ER for chest pain with nausea, sweating, shortness of breath, other concerning symptoms.

## 2019-05-10 ENCOUNTER — Emergency Department (HOSPITAL_COMMUNITY): Payer: BC Managed Care – PPO

## 2019-05-10 ENCOUNTER — Inpatient Hospital Stay (HOSPITAL_COMMUNITY)
Admission: EM | Admit: 2019-05-10 | Discharge: 2019-05-14 | DRG: 418 | Disposition: A | Discharge status: Home / Self Care | Payer: BC Managed Care – PPO | Attending: Internal Medicine | Admitting: Internal Medicine

## 2019-05-10 ENCOUNTER — Other Ambulatory Visit: Payer: Self-pay

## 2019-05-10 ENCOUNTER — Encounter (HOSPITAL_COMMUNITY): Payer: Self-pay

## 2019-05-10 DIAGNOSIS — R7989 Other specified abnormal findings of blood chemistry: Secondary | ICD-10-CM | POA: Diagnosis not present

## 2019-05-10 DIAGNOSIS — E876 Hypokalemia: Secondary | ICD-10-CM | POA: Diagnosis present

## 2019-05-10 DIAGNOSIS — K219 Gastro-esophageal reflux disease without esophagitis: Secondary | ICD-10-CM | POA: Diagnosis present

## 2019-05-10 DIAGNOSIS — J45909 Unspecified asthma, uncomplicated: Secondary | ICD-10-CM | POA: Diagnosis present

## 2019-05-10 DIAGNOSIS — K769 Liver disease, unspecified: Secondary | ICD-10-CM | POA: Diagnosis present

## 2019-05-10 DIAGNOSIS — Z79899 Other long term (current) drug therapy: Secondary | ICD-10-CM

## 2019-05-10 DIAGNOSIS — K9171 Accidental puncture and laceration of a digestive system organ or structure during a digestive system procedure: Secondary | ICD-10-CM | POA: Diagnosis not present

## 2019-05-10 DIAGNOSIS — Z419 Encounter for procedure for purposes other than remedying health state, unspecified: Secondary | ICD-10-CM

## 2019-05-10 DIAGNOSIS — K5900 Constipation, unspecified: Secondary | ICD-10-CM | POA: Diagnosis not present

## 2019-05-10 DIAGNOSIS — K859 Acute pancreatitis without necrosis or infection, unspecified: Secondary | ICD-10-CM | POA: Diagnosis present

## 2019-05-10 DIAGNOSIS — K851 Biliary acute pancreatitis without necrosis or infection: Principal | ICD-10-CM | POA: Diagnosis present

## 2019-05-10 DIAGNOSIS — K5909 Other constipation: Secondary | ICD-10-CM | POA: Diagnosis present

## 2019-05-10 DIAGNOSIS — Z20822 Contact with and (suspected) exposure to covid-19: Secondary | ICD-10-CM | POA: Diagnosis present

## 2019-05-10 LAB — URINALYSIS, ROUTINE W REFLEX MICROSCOPIC
Bilirubin Urine: NEGATIVE
Glucose, UA: NEGATIVE mg/dL
Hgb urine dipstick: NEGATIVE
Ketones, ur: 5 mg/dL — AB
Leukocytes,Ua: NEGATIVE
Nitrite: NEGATIVE
Protein, ur: 30 mg/dL — AB
Specific Gravity, Urine: 1.029 (ref 1.005–1.030)
pH: 5 (ref 5.0–8.0)

## 2019-05-10 LAB — TROPONIN I (HIGH SENSITIVITY)
Troponin I (High Sensitivity): <2 ng/L (ref <18)
Troponin I (High Sensitivity): 3 ng/L (ref <18)

## 2019-05-10 LAB — CBC
HCT: 43.1 % (ref 36.0–46.0)
Hemoglobin: 13.5 g/dL (ref 12.0–15.0)
MCH: 29.2 pg (ref 26.0–34.0)
MCHC: 31.3 g/dL (ref 30.0–36.0)
MCV: 93.3 fL (ref 80.0–100.0)
Platelets: 160 10*3/uL (ref 150–400)
RBC: 4.62 MIL/uL (ref 3.87–5.11)
RDW: 13.2 % (ref 11.5–15.5)
WBC: 6.9 10*3/uL (ref 4.0–10.5)
nRBC: 0 % (ref 0.0–0.2)

## 2019-05-10 LAB — COMPREHENSIVE METABOLIC PANEL
ALT: 180 U/L — ABNORMAL HIGH (ref 0–44)
AST: 216 U/L — ABNORMAL HIGH (ref 15–41)
Albumin: 3.7 g/dL (ref 3.5–5.0)
Alkaline Phosphatase: 108 U/L (ref 38–126)
Anion gap: 9 (ref 5–15)
BUN: 11 mg/dL (ref 6–20)
CO2: 24 mmol/L (ref 22–32)
Calcium: 8.8 mg/dL — ABNORMAL LOW (ref 8.9–10.3)
Chloride: 105 mmol/L (ref 98–111)
Creatinine, Ser: 0.9 mg/dL (ref 0.44–1.00)
GFR calc Af Amer: >60 mL/min (ref >60)
GFR calc non Af Amer: >60 mL/min (ref >60)
Glucose, Bld: 96 mg/dL (ref 70–99)
Potassium: 3.2 mmol/L — ABNORMAL LOW (ref 3.5–5.1)
Sodium: 138 mmol/L (ref 135–145)
Total Bilirubin: 1.4 mg/dL — ABNORMAL HIGH (ref 0.3–1.2)
Total Protein: 6.9 g/dL (ref 6.5–8.1)

## 2019-05-10 LAB — LIPASE, BLOOD: Lipase: 3181 U/L — ABNORMAL HIGH (ref 11–51)

## 2019-05-10 LAB — I-STAT BETA HCG BLOOD, ED (MC, WL, AP ONLY): I-stat hCG, quantitative: <5 m[IU]/mL (ref <5)

## 2019-05-10 MED ORDER — FENTANYL CITRATE (PF) 100 MCG/2ML IJ SOLN: 100.0000 ug | Freq: Once | INTRAMUSCULAR | Status: DC

## 2019-05-10 MED ORDER — SODIUM CHLORIDE 0.9 % IV BOLUS
1000.0000 mL | Freq: Once | INTRAVENOUS | Status: AC
  Administered 2019-05-10: 1000 mL via INTRAVENOUS

## 2019-05-10 MED ORDER — SODIUM CHLORIDE 0.9% FLUSH
3.0000 mL | Freq: Once | INTRAVENOUS | Status: AC
  Administered 2019-05-10: 3 mL via INTRAVENOUS

## 2019-05-10 MED ORDER — IOHEXOL 300 MG/ML  SOLN
100.0000 mL | Freq: Once | INTRAMUSCULAR | Status: AC | PRN
  Administered 2019-05-10: 100 mL via INTRAVENOUS

## 2019-05-10 NOTE — ED Triage Notes (Addendum)
Pt reports intermittent midsternum chest pain x4-5 days, new onset of epigastric pain today. Seen at [facility] yesterday for the same

## 2019-05-10 NOTE — ED Notes (Signed)
Pt transported to CT ?
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
## 2019-05-11 ENCOUNTER — Encounter (HOSPITAL_COMMUNITY): Payer: Self-pay | Admitting: Internal Medicine

## 2019-05-11 ENCOUNTER — Inpatient Hospital Stay (HOSPITAL_COMMUNITY): Payer: BC Managed Care – PPO

## 2019-05-11 DIAGNOSIS — K859 Acute pancreatitis without necrosis or infection, unspecified: Secondary | ICD-10-CM

## 2019-05-11 LAB — CBC WITH DIFFERENTIAL/PLATELET
Abs Immature Granulocytes: 0.01 10*3/uL (ref 0.00–0.07)
Basophils Absolute: 0 10*3/uL (ref 0.0–0.1)
Basophils Relative: 1 %
Eosinophils Absolute: 0.1 10*3/uL (ref 0.0–0.5)
Eosinophils Relative: 2 %
HCT: 39.5 % (ref 36.0–46.0)
Hemoglobin: 12.6 g/dL (ref 12.0–15.0)
Immature Granulocytes: 0 %
Lymphocytes Relative: 22 %
Lymphs Abs: 1.4 10*3/uL (ref 0.7–4.0)
MCH: 29.2 pg (ref 26.0–34.0)
MCHC: 31.9 g/dL (ref 30.0–36.0)
MCV: 91.6 fL (ref 80.0–100.0)
Monocytes Absolute: 0.4 10*3/uL (ref 0.1–1.0)
Monocytes Relative: 7 %
Neutro Abs: 4.4 10*3/uL (ref 1.7–7.7)
Neutrophils Relative %: 68 %
Platelets: 151 10*3/uL (ref 150–400)
RBC: 4.31 MIL/uL (ref 3.87–5.11)
RDW: 13.1 % (ref 11.5–15.5)
WBC: 6.4 10*3/uL (ref 4.0–10.5)
nRBC: 0 % (ref 0.0–0.2)

## 2019-05-11 LAB — HEPATIC FUNCTION PANEL
ALT: 168 U/L — ABNORMAL HIGH (ref 0–44)
AST: 108 U/L — ABNORMAL HIGH (ref 15–41)
Albumin: 3.4 g/dL — ABNORMAL LOW (ref 3.5–5.0)
Alkaline Phosphatase: 91 U/L (ref 38–126)
Bilirubin, Direct: 0.2 mg/dL (ref 0.0–0.2)
Indirect Bilirubin: 0.5 mg/dL (ref 0.3–0.9)
Total Bilirubin: 0.7 mg/dL (ref 0.3–1.2)
Total Protein: 6.4 g/dL — ABNORMAL LOW (ref 6.5–8.1)

## 2019-05-11 LAB — BASIC METABOLIC PANEL
Anion gap: 12 (ref 5–15)
BUN: 9 mg/dL (ref 6–20)
CO2: 20 mmol/L — ABNORMAL LOW (ref 22–32)
Calcium: 8.4 mg/dL — ABNORMAL LOW (ref 8.9–10.3)
Chloride: 105 mmol/L (ref 98–111)
Creatinine, Ser: 0.84 mg/dL (ref 0.44–1.00)
GFR calc Af Amer: >60 mL/min (ref >60)
GFR calc non Af Amer: >60 mL/min (ref >60)
Glucose, Bld: 78 mg/dL (ref 70–99)
Potassium: 3.2 mmol/L — ABNORMAL LOW (ref 3.5–5.1)
Sodium: 137 mmol/L (ref 135–145)

## 2019-05-11 LAB — HEPATITIS PANEL, ACUTE
HCV Ab: NONREACTIVE
Hep A IgM: NONREACTIVE
Hep B C IgM: NONREACTIVE
Hepatitis B Surface Ag: NONREACTIVE

## 2019-05-11 LAB — SURGICAL PCR SCREEN
MRSA, PCR: NEGATIVE
Staphylococcus aureus: POSITIVE — AB

## 2019-05-11 LAB — SARS CORONAVIRUS 2 (TAT 6-24 HRS): SARS Coronavirus 2: NEGATIVE

## 2019-05-11 LAB — TRIGLYCERIDES: Triglycerides: 42 mg/dL (ref <150)

## 2019-05-11 LAB — HIV ANTIBODY (ROUTINE TESTING W REFLEX): HIV Screen 4th Generation wRfx: NONREACTIVE

## 2019-05-11 MED ORDER — FLUTICASONE PROPIONATE 50 MCG/ACT NA SUSP
2.0000 | Freq: Every day | NASAL | Status: DC
  Filled 2019-05-11: qty 16

## 2019-05-11 MED ORDER — ALBUTEROL SULFATE (2.5 MG/3ML) 0.083% IN NEBU: 2.5000 mg | INHALATION_SOLUTION | RESPIRATORY_TRACT | Status: DC | PRN

## 2019-05-11 MED ORDER — POTASSIUM CHLORIDE 10 MEQ/100ML IV SOLN
10.0000 meq | INTRAVENOUS | Status: AC
  Administered 2019-05-11 (×4): 10 meq via INTRAVENOUS
  Filled 2019-05-11 (×4): qty 100

## 2019-05-11 MED ORDER — ONDANSETRON HCL 4 MG PO TABS: 4.0000 mg | ORAL_TABLET | Freq: Four times a day (QID) | ORAL | Status: DC | PRN

## 2019-05-11 MED ORDER — ONDANSETRON HCL 4 MG/2ML IJ SOLN
4.0000 mg | Freq: Four times a day (QID) | INTRAMUSCULAR | Status: DC | PRN
  Administered 2019-05-12: 4 mg via INTRAVENOUS
  Filled 2019-05-11: qty 2

## 2019-05-11 MED ORDER — DESOGESTREL-ETHINYL ESTRADIOL 0.15-0.02/0.01 MG (21/5) PO TABS
1.0000 | ORAL_TABLET | Freq: Every day | ORAL | Status: DC
  Administered 2019-05-11 – 2019-05-13 (×3): 1 via ORAL
  Filled 2019-05-11: qty 1

## 2019-05-11 MED ORDER — PIPERACILLIN-TAZOBACTAM 3.375 G IVPB
3.3750 g | Freq: Three times a day (TID) | INTRAVENOUS | Status: DC
  Administered 2019-05-11 – 2019-05-12 (×4): 3.375 g via INTRAVENOUS
  Filled 2019-05-11 (×6): qty 50

## 2019-05-11 MED ORDER — LACTATED RINGERS IV SOLN: INTRAVENOUS | Status: DC

## 2019-05-11 NOTE — Consult Note (Signed)
[facility] Consultation Note  Referring Provider: [facility] Primary Care Physician:  Renaye Rakers, MD  Reason for Consultation:  Abdominal pain, pancreatitis  HPI: Jasmine [name] is a 39 y.o. female admitted with severe epigastric pain.  Started couple days ago, associated with retrosternal burning; went to urgent care, treated for GERD without improvement.  Symptoms progressively worsening; went to ED.  Associated nausea and vomiting.  Reports intermittent abdominal pain, nausea, vomiting, worse after eating fatty foods, over the past several months (at least).  No change in appetite or weight loss.  No hematemesis, melena, hematochezia.  Chronic constipation.  No NSAIDs.  No prior prior history of pancreatitis; no family history of pancreatitis.  Studies showed CT pancreatitis and elevated liver enzymes and gallstones; MRCP; no choledocholithiasis.   Past Medical History:  Diagnosis Date  . Active labor 01/07/2014  . Allergic rhinitis   . Asthma   . Postpartum care following vaginal delivery (11/14) 01/08/2014  . Thrombocytopenia (HCC) 12/19/2013    Past Surgical History:  Procedure Laterality Date  . tubal resection      Prior to Admission medications   Medication Sig Start Date End Date Taking? Authorizing Provider  albuterol (PROAIR HFA) 108 (90 Base) MCG/ACT inhaler Inhale 2 puffs into the lungs every 4 (four) hours as needed for wheezing or shortness of breath. 07/27/17  Yes [place], Betty G, MD  fluticasone (FLONASE) 50 MCG/ACT nasal spray SPRAY 2 SPRAYS INTO EACH NOSTRIL EVERY DAY Patient taking differently: Place 2 sprays into both nostrils daily.  10/23/17  Yes [place], Betty G, MD  KARIVA 0.15-0.02/0.01 MG (21/5) tablet Take 1 tablet by mouth daily. 07/02/17  Yes [provider]  omeprazole (PRILOSEC) 20 MG capsule Take 1 capsule (20 mg total) by mouth daily. 05/09/19  Yes Moshe Cipro, NP  OZEMPIC, 1 MG/DOSE, 2 MG/1.5ML SOPN Inject 0.75 mLs into the skin once a  week. 04/28/19  Yes [provider]    Current Facility-Administered Medications  Medication Dose Route Frequency Provider Last Rate Last Admin  . albuterol (PROVENTIL) (2.5 MG/3ML) 0.083% nebulizer solution 2.5 mg  2.5 mg Inhalation Q4H PRN Eduard Clos, MD      . fluticasone (FLONASE) 50 MCG/ACT nasal spray 2 spray  2 spray Each Nare Daily Eduard Clos, MD      . lactated ringers infusion   Intravenous Continuous Eduard Clos, MD   Stopped at 05/11/19 [phone]  . ondansetron (ZOFRAN) tablet 4 mg  4 mg Oral Q6H PRN Eduard Clos, MD       Or  . ondansetron [facility]) injection 4 mg  4 mg Intravenous Q6H PRN Eduard Clos, MD      . piperacillin-tazobactam (ZOSYN) IVPB 3.375 g  3.375 g Intravenous Q8H Eduard Clos, MD   Stopped at 05/11/19 [phone]  . potassium chloride 10 mEq in 100 mL IVPB  10 mEq Intravenous Q1 Hr x 4 Edsel Petrin, DO   Stopped at 05/11/19 1207   Current Outpatient Medications  Medication Sig Dispense Refill  . albuterol (PROAIR HFA) 108 (90 Base) MCG/ACT inhaler Inhale 2 puffs into the lungs every 4 (four) hours as needed for wheezing or shortness of breath. 8.5 Inhaler 2  . fluticasone (FLONASE) 50 MCG/ACT nasal spray SPRAY 2 SPRAYS INTO EACH NOSTRIL EVERY DAY (Patient taking differently: Place 2 sprays into both nostrils daily. ) 48 g 1  . KARIVA 0.15-0.02/0.01 MG (21/5) tablet Take 1 tablet by mouth daily.  11  . omeprazole (PRILOSEC) 20 MG  capsule Take 1 capsule (20 mg total) by mouth daily. 30 capsule 0  . OZEMPIC, 1 MG/DOSE, 2 MG/1.5ML SOPN Inject 0.75 mLs into the skin once a week.      Allergies as of 05/10/2019  . (No Known Allergies)    Family History  Problem Relation Age of Onset  . Cancer Father   . Diabetes Brother     Social History   Socioeconomic History  . Marital status: Married    Spouse name: Not on file  . Number of children: Not on file  . Years of education: Not on file  . Highest  education level: Not on file  Occupational History  . Not on file  Tobacco Use  . Smoking status: Never Smoker  . Smokeless tobacco: Never Used  Substance and Sexual Activity  . Alcohol use: No  . Drug use: No  . Sexual activity: Not on file  Other Topics Concern  . Not on file  Social History Narrative  . Not on file   Social Determinants of Health   Financial Resource Strain:   . Difficulty of Paying Living Expenses:   Food Insecurity:   . Worried About Programme researcher, broadcasting/film/video in the Last Year:   . Barista in the Last Year:   Transportation Needs:   . Freight forwarder (Medical):   Marland Kitchen Lack of Transportation (Non-Medical):   Physical Activity:   . Days of Exercise per Week:   . Minutes of Exercise per Session:   Stress:   . Feeling of Stress :   Social Connections:   . Frequency of Communication with Friends and Family:   . Frequency of Social Gatherings with Friends and Family:   . Attends Religious Services:   . Active Member of Clubs or Organizations:   . Attends Banker Meetings:   Marland Kitchen Marital Status:   Intimate Partner Violence:   . Fear of Current or Ex-Partner:   . Emotionally Abused:   Marland Kitchen Physically Abused:   . Sexually Abused:     Review of Systems: as per HPI, all others negative  Physical Exam: Vital signs in last 24 hours: Temp:  [98.2 F (36.8 C)] 98.2 F (36.8 C) (03/16 1554) Pulse Rate:  [63-98] 96 (03/17 0900) Resp:  [14-23] 20 (03/17 0900) BP: (104-138)/(61-79) 119/74 (03/17 0900) SpO2:  [98 %-100 %] 99 % (03/17 0900)   General:   Alert,  Well-developed, well-nourished, pleasant and cooperative in NAD Head:  Normocephalic and atraumatic. Eyes:  Sclera clear, no icterus.   Conjunctiva pink. Ears:  Normal auditory acuity. Nose:  No deformity, discharge,  or lesions. Mouth:  No deformity or lesions.  Oropharynx pink & moist. Neck:  Supple; no masses or thyromegaly. Lungs:  Clear throughout to auscultation.   No wheezes,  crackles, or rhonchi. No acute distress. Heart:  Regular rate and rhythm; no murmurs, clicks, rubs,  or gallops. Abdomen:  Soft, mild epigastric tenderness, without peritonitis. No masses, hepatosplenomegaly or hernias noted. Normal bowel sounds, without guarding, and without rebound.     Msk:  Symmetrical without gross deformities. Normal posture. Pulses:  Normal pulses noted. Extremities:  Without clubbing or edema. Neurologic:  Alert and  oriented x4;  grossly normal neurologically. Skin:  Intact without significant lesions or rashes. Cervical Nodes:  No significant cervical adenopathy. Psych:  Alert and cooperative. Normal mood and affect.   Lab Results: Recent Labs    05/10/19 1613 05/11/19 0523  WBC 6.9 6.4  HGB 13.5 12.6  HCT 43.1 39.5  PLT 160 151   BMET Recent Labs    05/10/19 1613 05/11/19 0523  NA 138 137  K 3.2* 3.2*  CL 105 105  CO2 24 20*  GLUCOSE 96 78  BUN 11 9  CREATININE 0.90 0.84  CALCIUM 8.8* 8.4*   LFT Recent Labs    05/11/19 0523  PROT 6.4*  ALBUMIN 3.4*  AST 108*  ALT 168*  ALKPHOS 91  BILITOT 0.7  BILIDIR 0.2  IBILI 0.5   PT/INR No results for input(s): LABPROT, INR in the last 72 hours.  Studies/Results: CT Abdomen Pelvis W Contrast  Result Date: 05/10/2019 CLINICAL DATA:  Acute abdominal pain. EXAM: CT ABDOMEN AND PELVIS WITH CONTRAST TECHNIQUE: Multidetector CT imaging of the abdomen and pelvis was performed using the standard protocol following bolus administration of intravenous contrast. Automatic exposure control utilized. CONTRAST:  175mL OMNIPAQUE IOHEXOL 300 MG/ML  SOLN COMPARISON:  None. FINDINGS: Lower chest: Normal. Hepatobiliary: None mild periportal edema. Partial characterization of an 18 mm central hypoenhancing lesion with some nodular peripheral enhancement and vascularity which would favor a benign hemangioma etiology. Patent hepatic and portal veins. Normal hepatic size and contours. Normal gallbladder and common bile  duct. Pancreas: Mild heterogeneity of the pancreatic parenchyma with effacement of the normal lobular contours concerning for a subtle acute pancreatitis. Backspace no sequela of chronic pancreatitis. Paucity of mesenteric fat, including in the peripancreatic region. No obvious free fluid, adenopathy or pseudocyst. Patent splenic vein. No evidence for pancreatic necrosis. Spleen: Normal. Adrenals/Urinary Tract: No apparent abnormality. Stomach/Bowel: Normal appendix, axial series 3, image 58. Moderate stool burden without bowel obstruction or apparent bowel wall thickening. Decompressed stomach without an apparent abnormality. Vascular/Lymphatic: No adenopathy. 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 Normal caliber of the abdominal aorta without calcified atherosclerosis. Reproductive: Normally sized uterus with possible small uterine fibroid in. A left ovarian 13 mm benign functional cyst. Otherwise normal appearance of both ovaries. Other: No free intraperitoneal fluid or air. Musculoskeletal: Mild degenerative change and acquired disc disease at the lumbosacral junction with grade 1 retrolisthesis of L5 on S1 that is likely degenerative. Mild left sacroiliitis. IMPRESSION: Imaging findings that would support a clinical diagnosis of an acute mild pancreatitis without portal vein or splenic vein thrombosis or pancreatic necrosis. No pseudocyst or sequela of chronic pancreatitis. Mild periportal edema with normal gallbladder and common bile duct. An 18 mm hypoenhancing hepatic lesion with peripheral nodular enhancement in vascularity, probably a benign hemangioma. No further MR characterization is indicated. Given the elevated hepatic enzymes and abdominal pain, surveillance ultrasonography could be considered in 3-6 months. Aortic Atherosclerosis (ICD10-I70.0). Electronically Signed   By: Revonda Humphrey   On: 05/10/2019 23:09   MR ABDOMEN MRCP WO  CONTRAST  Addendum Date: 05/11/2019   ADDENDUM REPORT: 05/11/2019 08:12 ADDENDUM: Presumed hepatic hemangioma as described. Electronically Signed   By: Zetta Bills M.D.   On: 05/11/2019 08:12   Result Date: 05/11/2019 CLINICAL DATA:  Abdominal pain, suspected pancreatitis. EXAM: MRI ABDOMEN WITHOUT CONTRAST  (INCLUDING MRCP) TECHNIQUE: Multiplanar multisequence MR imaging of the abdomen was performed. Heavily T2-weighted images of the biliary and pancreatic ducts were obtained, and three-dimensional MRCP images were rendered by post processing. COMPARISON:  CT evaluation of 05/10/2019 FINDINGS: Lower chest: Incidental imaging of the lung bases is unremarkable, limited assessment on MRI. Hepatobiliary: Presumed hemangioma in the right hepatic lobe. Similar to previous CT. No signs of biliary  ductal dilation. Common bile duct is normal, without filling defect. Variant hepatic ductal anatomy with posterior division right hepatic duct draining into the biliary confluence from the superior liver and inferior right hepatic duct draining just below the biliary confluence but well above the cystic duct confluence. Numerous filling defects in the gallbladder and cystic duct, a large stone in the neck of the gallbladder measuring approximately 1-1.2 cm. Small amount of pericholecystic fluid. Contrast was not administered, this limits assessment of gallbladder wall. Pancreas: Little if any pancreatic edema. No focal peripancreatic fluid. Spleen:  Normal size spleen, no focal lesion. Adrenals/Urinary Tract: Normal appearance of the adrenal glands. Kidneys with smooth renal contours. No signs of hydronephrosis. Stomach/Bowel: Limited assessment of the gastrointestinal tract on MRI. No signs of bowel obstruction or acute bowel process Vascular/Lymphatic: Lack of contrast limits assessment. Vascular structures are grossly patent. Other:  None Musculoskeletal: No suspicious bone lesions identified. IMPRESSION: 1. Distended  gallbladder filled with numerous gallstones with a large gallstone in the neck of the gallbladder as described. Small amount of pericholecystic fluid. Findings could indicate early acute calculus cholecystitis. HIDA scan may be useful for further assessment as clinically warranted. 2. Minimal pancreatic edema. Correlate with pancreatic enzymes. No ductal dilation or peripancreatic fluid. 3. Variant hepatic ductal anatomy as described above. Electronically Signed: By: Donzetta Kohut M.D. On: 05/11/2019 08:08   Impression:  1.  Acute gallstone pancreatitis. 2.  Gallstones. 3.  Elevated LFTs, likely from #2 above; MRCP negative bile duct stones. 4.  Abdominal pain, likely #1 and #2 above.  Plan:  1.  Supportive management for pancreatitis, with IV fluids, analgesics, NPO per surgical recommendations. 2.  Follow LFTs and clinical course. 3.  Surgical evaluation for cholecystectomy + IOC is underway; no further GI recommendations; please call us back if IOC positive or any further needs. 4.  [facility] will follow along at a distance.   LOS: 1 day   Rithwik Schmieg M  05/11/2019, 1:08 PM  Cell [phone] If no answer or after 5 PM call [phone]

## 2019-05-11 NOTE — H&P (Signed)
History and Physical    Jasmine [name] NAT:[id number] DOB: [birth year] DOA: 05/10/2019  PCP: Lucianne Lei, MD  Patient coming from: Home.  Chief Complaint: Abdominal pain.  HPI: Jasmine [name] is a 39 y.o. female with no significant past medical history presents to the ER because of abdominal pain.  Patient has been having abdominal pain for last 3 to 4 days mostly in the epigastric area with nausea vomiting denies any diarrhea.  Denies any blood in the vomitus.  Denies any fever chills or chest pain.  Denies drinking alcohol.  Had gone to urgent care was prescribed PPI despite taking which patient was denying pain.  Pain is stabbing in nature nonradiating.  Patient used to be on Ozempic weight loss until 2 months ago and has not taken anything for last 2 months.  ED Course: In the ER labs reveal lipase of 3100 AST 216 ALT 180 total bilirubin 1.4.  EKG normal sinus rhythm triglycerides 42 troponin was negative.  CT abdomen pelvis shows features concerning for acute pancreatitis with the periportal edema no gallstones.  Patient admitted for acute pancreatitis aggressive hydration.  Covid test was negative.  Review of Systems: As per HPI, rest all negative.   Past Medical History:  Diagnosis Date  . Active labor 01/07/2014  . Allergic rhinitis   . Asthma   . Postpartum care following vaginal delivery (11/14) 01/08/2014  . Thrombocytopenia ([place]) 12/19/2013    Past Surgical History:  Procedure Laterality Date  . tubal resection       reports that she has never smoked. She has never used smokeless tobacco. She reports that she does not drink alcohol or use drugs.  No Known Allergies  Family History  Problem Relation Age of Onset  . Cancer Father   . Diabetes Brother     Prior to Admission medications   Medication Sig Start Date End Date Taking? Authorizing Provider  albuterol (PROAIR HFA) 108 (90 Base) MCG/ACT inhaler Inhale 2 puffs into the lungs every 4 (four) hours as needed  for wheezing or shortness of breath. 07/27/17  Yes [place], Betty G, MD  fluticasone (FLONASE) 50 MCG/ACT nasal spray SPRAY 2 SPRAYS INTO EACH NOSTRIL EVERY DAY Patient taking differently: Place 2 sprays into both nostrils daily.  10/23/17  Yes [place], Betty G, MD  KARIVA 0.15-0.02/0.01 MG (21/5) tablet Take 1 tablet by mouth daily. 07/02/17  Yes [provider]  omeprazole (PRILOSEC) 20 MG capsule Take 1 capsule (20 mg total) by mouth daily. 05/09/19  Yes Faustino Congress, NP  OZEMPIC, 1 MG/DOSE, 2 MG/1.5ML SOPN Inject 0.75 mLs into the skin once a week. 04/28/19  Yes [provider]    Physical Exam: Constitutional: Moderately built and nourished. Vitals:   05/10/19 1554 05/10/19 1935 05/10/19 2130  BP: 119/79 125/79 131/77  Pulse: 76 78 78  Resp: 17 15 17   Temp: 98.2 F (36.8 C)    TempSrc: Oral    SpO2: 98% 100% 100%   Eyes: Anicteric no pallor. ENMT: No discharge from the ears eyes nose or mouth. Neck: No mass felt.  No neck rigidity. Respiratory: No rhonchi or crepitations. Cardiovascular: S1-S2 heard. Abdomen: Soft nontender bowel sounds present. Musculoskeletal: No edema. Skin: No rash. Neurologic: Alert awake oriented to time place and person.  Moves all extremities. Psychiatric: Appears normal.   Labs on Admission: I have personally reviewed following labs and imaging studies  CBC: Recent Labs  Lab 05/10/19 1613  WBC 6.9  HGB 13.5  HCT 43.1  MCV 93.3  PLT 160   Basic Metabolic Panel: Recent Labs  Lab 05/10/19 1613  NA 138  K 3.2*  CL 105  CO2 24  GLUCOSE 96  BUN 11  CREATININE 0.90  CALCIUM 8.8*   GFR: Estimated Creatinine Clearance: 87.1 mL/min (by C-G formula based on SCr of 0.9 mg/dL). Liver Function Tests: Recent Labs  Lab 05/10/19 1613  AST 216*  ALT 180*  ALKPHOS 108  BILITOT 1.4*  PROT 6.9  ALBUMIN 3.7   Recent Labs  Lab 05/10/19 1613  LIPASE 3,181*   No results for input(s): AMMONIA in the last 168  hours. Coagulation Profile: No results for input(s): INR, PROTIME in the last 168 hours. Cardiac Enzymes: No results for input(s): CKTOTAL, CKMB, CKMBINDEX, TROPONINI in the last 168 hours. BNP (last 3 results) No results for input(s): PROBNP in the last 8760 hours. HbA1C: No results for input(s): HGBA1C in the last 72 hours. CBG: No results for input(s): GLUCAP in the last 168 hours. Lipid Profile: No results for input(s): CHOL, HDL, LDLCALC, TRIG, CHOLHDL, LDLDIRECT in the last 72 hours. Thyroid Function Tests: No results for input(s): TSH, T4TOTAL, FREET4, T3FREE, THYROIDAB in the last 72 hours. Anemia Panel: No results for input(s): VITAMINB12, FOLATE, FERRITIN, TIBC, IRON, RETICCTPCT in the last 72 hours. Urine analysis:    Component Value Date/Time   COLORURINE AMBER (A) 05/10/2019 2004   APPEARANCEUR CLEAR 05/10/2019 2004   LABSPEC 1.029 05/10/2019 2004   PHURINE 5.0 05/10/2019 2004   GLUCOSEU NEGATIVE 05/10/2019 2004   HGBUR NEGATIVE 05/10/2019 2004   BILIRUBINUR NEGATIVE 05/10/2019 2004   BILIRUBINUR n 11/20/2011 1558   KETONESUR 5 (A) 05/10/2019 2004   PROTEINUR 30 (A) 05/10/2019 2004   UROBILINOGEN 0.2 11/20/2011 1558   UROBILINOGEN 0.2 09/14/2006 1910   NITRITE NEGATIVE 05/10/2019 2004   LEUKOCYTESUR NEGATIVE 05/10/2019 2004   Sepsis Labs: @LABRCNTIP (procalcitonin:4,lacticidven:4) )No results found for this or any previous visit (from the past 240 hour(s)).   Radiological Exams on Admission: CT Abdomen Pelvis W Contrast  Result Date: 05/10/2019 CLINICAL DATA:  Acute abdominal pain. EXAM: CT ABDOMEN AND PELVIS WITH CONTRAST TECHNIQUE: Multidetector CT imaging of the abdomen and pelvis was performed using the standard protocol following bolus administration of intravenous contrast. Automatic exposure control utilized. CONTRAST:  05/12/2019 OMNIPAQUE IOHEXOL 300 MG/ML  SOLN COMPARISON:  None. FINDINGS: Lower chest: Normal. Hepatobiliary: None mild periportal edema.  Partial characterization of an 18 mm central hypoenhancing lesion with some nodular peripheral enhancement and vascularity which would favor a benign hemangioma etiology. Patent hepatic and portal veins. Normal hepatic size and contours. Normal gallbladder and common bile duct. Pancreas: Mild heterogeneity of the pancreatic parenchyma with effacement of the normal lobular contours concerning for a subtle acute pancreatitis. Backspace no sequela of chronic pancreatitis. Paucity of mesenteric fat, including in the peripancreatic region. No obvious free fluid, adenopathy or pseudocyst. Patent splenic vein. No evidence for pancreatic necrosis. Spleen: Normal. Adrenals/Urinary Tract: No apparent abnormality. Stomach/Bowel: Normal appendix, axial series 3, image 58. Moderate stool burden without bowel obstruction or apparent bowel wall thickening. Decompressed stomach without an apparent abnormality. Vascular/Lymphatic: No adenopathy. 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 Normal caliber of the abdominal aorta without calcified atherosclerosis. Reproductive: Normally sized uterus with possible small uterine fibroid in. A left ovarian 13 mm benign functional cyst. Otherwise normal appearance of both ovaries. Other: No free intraperitoneal fluid or air. Musculoskeletal: Mild degenerative change and acquired disc disease at  the lumbosacral junction with grade 1 retrolisthesis of L5 on S1 that is likely degenerative. Mild left sacroiliitis. IMPRESSION: Imaging findings that would support a clinical diagnosis of an acute mild pancreatitis without portal vein or splenic vein thrombosis or pancreatic necrosis. No pseudocyst or sequela of chronic pancreatitis. Mild periportal edema with normal gallbladder and common bile duct. An 18 mm hypoenhancing hepatic lesion with peripheral nodular enhancement in vascularity, probably a benign hemangioma. No further MR characterization is  indicated. Given the elevated hepatic enzymes and abdominal pain, surveillance ultrasonography could be considered in 3-6 months. Aortic Atherosclerosis (ICD10-I70.0). Electronically Signed   By: Laurence Ferrari   On: 05/10/2019 23:09    EKG: Independently reviewed.  Normal sinus rhythm.  Assessment/Plan Principal Problem:   Acute pancreatitis Active Problems:   Asthma    1. Acute pancreatitis -cause not clear.  Patient denies drinking alcohol and CAT scan does not show any gallstones.  We will keep patient n.p.o. and continue with aggressive hydration follow LFTs and bili MRCP.  Triglycerides were normal. 2. Elevated LFTs -check acute hepatitis panel and also follow MRCP. 3. Abnormal lesions in the liver will need follow-up.  GERD the patient is acute pancreatitis will need close monitoring for any further deterioration in inpatient status.   DVT prophylaxis: SCDs for now until we get MRCP to make sure there is no gallstones and then will avoid anticoagulation.  Also LFTs elevated. Code Status: Full code. Family Communication: Discussed with patient. Disposition Plan: Home. Consults called: None. Admission status: Inpatient.   Eduard Clos MD [facility] Pager [phone].  If 7PM-7AM, please contact night-coverage [email or web address] Password [facility]  05/11/2019, 12:06 AM

## 2019-05-11 NOTE — Progress Notes (Signed)
PROGRESS NOTE    Jasmine [name]  HEN:[id number] DOB: 1980-11-11 DOA: 05/10/2019 PCP: Lucianne Lei, MD   Brief Narrative:  HPI on 05/11/2019 by Dr. Gean Birchwood Vanissa Strength is a 39 y.o. female with no significant past medical history presents to the ER because of abdominal pain.  Patient has been having abdominal pain for last 3 to 4 days mostly in the epigastric area with nausea vomiting denies any diarrhea.  Denies any blood in the vomitus.  Denies any fever chills or chest pain.  Denies drinking alcohol.  Had gone to urgent care was prescribed PPI despite taking which patient was denying pain.  Pain is stabbing in nature nonradiating.  Patient used to be on Ozempic weight loss until 2 months ago and has not taken anything for last 2 months.  Interim history Admitted this morning with abdominal pain.  Found to have elevated lipase of over 3000 as well as LFTs.  General surgery gastroenterology consulted and appreciated.  CT abdomen pelvis showed acute pancreatitis with periportal edema.  MRCP pending.  Continue IV fluids. Assessment & Plan   Acute pancreatitis -Possibly secondary to gallbladder disease.  Patient denies any chronic alcohol use, only occasional/social drinking. -CT abdomen/pelvis: Acute mild pancreatitis without portal venous splenic vein thrombosis or pancreatic necrosis.  Mild periportal edema. -MRCP: Distended gallbladder filled with numerous gallstones with a large gallstone in the neck of the gallbladder as described.  Small amount of pericholecystic fluid.  Early acute calculus cholecystitis. -Lipase of 3181 on admission -Patient with elevated LFTs -General surgery as well as gastroenterology consulted and appreciated -Continue IV fluids, n.p.o., antiemetics as needed  Elevated LFTs -As above, pending MRCP -Hepatitis panel pending  Hypokalemia -Replacing continue to monitor  Hepatic lesion -CT abdomen pelvis showed 18 mm hypoenhancing hepatic lesion with  peri peripheral nodular enhancement and vascularity, probably benign hemangioma.  Consider ultrasound in 3 to 6 months.  DVT Prophylaxis  SCDs  Code Status: Full  Family Communication: None at bedside.  Disposition Plan: Admitted from home for abdominal pain.  Found to have acute pancreatitis likely secondary to gallbladder disease.  General surgery and GI consulted.  Disposition TBD.  Consultants Gastroenterology General surgery  Procedures  MRCP  Antibiotics   Anti-infectives (From admission, onward)   Start     Dose/Rate Route Frequency Ordered Stop   05/11/19 0600  piperacillin-tazobactam (ZOSYN) IVPB 3.375 g     3.375 g 12.5 mL/hr over 240 Minutes Intravenous Every 8 hours 05/11/19 0549        Subjective:   Jasmine [name] seen and examined today.  Patient hoping to have some ice chips.  Continues to have some abdominal pain.  Denies current nausea.  Denies chest pain or shortness of breath.  Does complain of some constipation and acid reflux.  Denies current dizziness or headache.  Objective:   Vitals:   05/11/19 0400 05/11/19 0514 05/11/19 0600 05/11/19 0700  BP: 110/63 113/75 111/68 106/69  Pulse: 75 81 72 95  Resp: 16 18 17  (!) 23  Temp:      TempSrc:      SpO2: 100% 100% 99% 99%   No intake or output data in the 24 hours ending 05/11/19 0818 There were no vitals filed for this visit.  Exam  General: Well developed, well nourished, NAD, appears stated age  HEENT: NCAT, mucous membranes moist.   Cardiovascular: S1 S2 auscultated, tachycardic  Respiratory: Clear to auscultation bilaterally with equal chest rise  Abdomen: Soft, RUQ/epigastric TTP, nondistended, +  bowel sounds  Extremities: warm dry without cyanosis clubbing or edema  Neuro: AAOx3, nonfocal  Psych: Pleasant, appropriate mood and affect   Data Reviewed: I have personally reviewed following labs and imaging studies  CBC: Recent Labs  Lab 05/10/19 1613 05/11/19 0523  WBC 6.9 6.4    NEUTROABS  --  4.4  HGB 13.5 12.6  HCT 43.1 39.5  MCV 93.3 91.6  PLT 160 151   Basic Metabolic Panel: Recent Labs  Lab 05/10/19 1613 05/11/19 0523  NA 138 137  K 3.2* 3.2*  CL 105 105  CO2 24 20*  GLUCOSE 96 78  BUN 11 9  CREATININE 0.90 0.84  CALCIUM 8.8* 8.4*   GFR: Estimated Creatinine Clearance: 93.3 mL/min (by C-G formula based on SCr of 0.84 mg/dL). Liver Function Tests: Recent Labs  Lab 05/10/19 1613 05/11/19 0523  AST 216* 108*  ALT 180* 168*  ALKPHOS 108 91  BILITOT 1.4* 0.7  PROT 6.9 6.4*  ALBUMIN 3.7 3.4*   Recent Labs  Lab 05/10/19 1613  LIPASE 3,181*   No results for input(s): AMMONIA in the last 168 hours. Coagulation Profile: No results for input(s): INR, PROTIME in the last 168 hours. Cardiac Enzymes: No results for input(s): CKTOTAL, CKMB, CKMBINDEX, TROPONINI in the last 168 hours. BNP (last 3 results) No results for input(s): PROBNP in the last 8760 hours. HbA1C: No results for input(s): HGBA1C in the last 72 hours. CBG: No results for input(s): GLUCAP in the last 168 hours. Lipid Profile: Recent Labs    05/11/19 0046  TRIG 42   Thyroid Function Tests: No results for input(s): TSH, T4TOTAL, FREET4, T3FREE, THYROIDAB in the last 72 hours. Anemia Panel: No results for input(s): VITAMINB12, FOLATE, FERRITIN, TIBC, IRON, RETICCTPCT in the last 72 hours. Urine analysis:    Component Value Date/Time   COLORURINE AMBER (A) 05/10/2019 2004   APPEARANCEUR CLEAR 05/10/2019 2004   LABSPEC 1.029 05/10/2019 2004   PHURINE 5.0 05/10/2019 2004   GLUCOSEU NEGATIVE 05/10/2019 2004   HGBUR NEGATIVE 05/10/2019 2004   BILIRUBINUR NEGATIVE 05/10/2019 2004   BILIRUBINUR n 11/20/2011 1558   KETONESUR 5 (A) 05/10/2019 2004   PROTEINUR 30 (A) 05/10/2019 2004   UROBILINOGEN 0.2 11/20/2011 1558   UROBILINOGEN 0.2 09/14/2006 1910   NITRITE NEGATIVE 05/10/2019 2004   LEUKOCYTESUR NEGATIVE 05/10/2019 2004   Sepsis  Labs: @LABRCNTIP (procalcitonin:4,lacticidven:4)  ) Recent Results (from the past 240 hour(s))  SARS CORONAVIRUS 2 (TAT 6-24 HRS) Nasopharyngeal Nasopharyngeal Swab     Status: None   Collection Time: 05/10/19 11:37 PM   Specimen: Nasopharyngeal Swab  Result Value Ref Range Status   SARS Coronavirus 2 NEGATIVE NEGATIVE Final    Comment: (NOTE) SARS-CoV-2 target nucleic acids are NOT DETECTED. The SARS-CoV-2 RNA is generally detectable in upper and lower respiratory specimens during the acute phase of infection. Negative results do not preclude SARS-CoV-2 infection, do not rule out co-infections with other pathogens, and should not be used as the sole basis for treatment or other patient management decisions. Negative results must be combined with clinical observations, patient history, and epidemiological information. The expected result is Negative. Fact Sheet for Patients: 05/12/19 Fact Sheet for Healthcare Providers: HairSlick.no This test is not yet approved or cleared by the quierodirigir.com FDA and  has been authorized for detection and/or diagnosis of SARS-CoV-2 by FDA under an Emergency Use Authorization (EUA). This EUA will remain  in effect (meaning this test can be used) for the duration of the COVID-19 declaration under  Section 56 4(b)(1) of the Act, 21 U.S.C. section 360bbb-3(b)(1), unless the authorization is terminated or revoked sooner. Performed at [facility], [address] [place], [place] [address]       Radiology Studies: CT Abdomen Pelvis W Contrast  Result Date: 05/10/2019 CLINICAL DATA:  Acute abdominal pain. EXAM: CT ABDOMEN AND PELVIS WITH CONTRAST TECHNIQUE: Multidetector CT imaging of the abdomen and pelvis was performed using the standard protocol following bolus administration of intravenous contrast. Automatic exposure control utilized. CONTRAST:  OMNIPAQUE IOHEXOL 300 MG/ML   SOLN COMPARISON:  None. FINDINGS: Lower chest: Normal. Hepatobiliary: None mild periportal edema. Partial characterization of an 18 mm central hypoenhancing lesion with some nodular peripheral enhancement and vascularity which would favor a benign hemangioma etiology. Patent hepatic and portal veins. Normal hepatic size and contours. Normal gallbladder and common bile duct. Pancreas: Mild heterogeneity of the pancreatic parenchyma with effacement of the normal lobular contours concerning for a subtle acute pancreatitis. Backspace no sequela of chronic pancreatitis. Paucity of mesenteric fat, including in the peripancreatic region. No obvious free fluid, adenopathy or pseudocyst. Patent splenic vein. No evidence for pancreatic necrosis. Spleen: Normal. Adrenals/Urinary Tract: No apparent abnormality. Stomach/Bowel: Normal appendix, axial series 3, image 58. Moderate stool burden without bowel obstruction or apparent bowel wall thickening. Decompressed stomach without an apparent abnormality. Vascular/Lymphatic: No adenopathy. 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 Normal caliber of the abdominal aorta without calcified atherosclerosis. Reproductive: Normally sized uterus with possible small uterine fibroid in. A left ovarian 13 mm benign functional cyst. Otherwise normal appearance of both ovaries. Other: No free intraperitoneal fluid or air. Musculoskeletal: Mild degenerative change and acquired disc disease at the lumbosacral junction with grade 1 retrolisthesis of L5 on S1 that is likely degenerative. Mild left sacroiliitis. IMPRESSION: Imaging findings that would support a clinical diagnosis of an acute mild pancreatitis without portal vein or splenic vein thrombosis or pancreatic necrosis. No pseudocyst or sequela of chronic pancreatitis. Mild periportal edema with normal gallbladder and common bile duct. An 18 mm hypoenhancing hepatic lesion with peripheral  nodular enhancement in vascularity, probably a benign hemangioma. No further MR characterization is indicated. Given the elevated hepatic enzymes and abdominal pain, surveillance ultrasonography could be considered in 3-6 months. Aortic Atherosclerosis (ICD10-I70.0). Electronically Signed   By: Laurence Ferrari   On: 05/10/2019 23:09   MR ABDOMEN MRCP WO CONTRAST  Addendum Date: 05/11/2019   ADDENDUM REPORT: 05/11/2019 08:12 ADDENDUM: Presumed hepatic hemangioma as described. Electronically Signed   By: Donzetta Kohut M.D.   On: 05/11/2019 08:12   Result Date: 05/11/2019 CLINICAL DATA:  Abdominal pain, suspected pancreatitis. EXAM: MRI ABDOMEN WITHOUT CONTRAST  (INCLUDING MRCP) TECHNIQUE: Multiplanar multisequence MR imaging of the abdomen was performed. Heavily T2-weighted images of the biliary and pancreatic ducts were obtained, and three-dimensional MRCP images were rendered by post processing. COMPARISON:  CT evaluation of 05/10/2019 FINDINGS: Lower chest: Incidental imaging of the lung bases is unremarkable, limited assessment on MRI. Hepatobiliary: Presumed hemangioma in the right hepatic lobe. Similar to previous CT. No signs of biliary ductal dilation. Common bile duct is normal, without filling defect. Variant hepatic ductal anatomy with posterior division right hepatic duct draining into the biliary confluence from the superior liver and inferior right hepatic duct draining just below the biliary confluence but well above the cystic duct confluence. Numerous filling defects in the gallbladder and cystic duct, a large stone in the neck of the gallbladder measuring  approximately 1-1.2 cm. Small amount of pericholecystic fluid. Contrast was not administered, this limits assessment of gallbladder wall. Pancreas: Little if any pancreatic edema. No focal peripancreatic fluid. Spleen:  Normal size spleen, no focal lesion. Adrenals/Urinary Tract: Normal appearance of the adrenal glands. Kidneys with smooth  renal contours. No signs of hydronephrosis. Stomach/Bowel: Limited assessment of the gastrointestinal tract on MRI. No signs of bowel obstruction or acute bowel process Vascular/Lymphatic: Lack of contrast limits assessment. Vascular structures are grossly patent. Other:  None Musculoskeletal: No suspicious bone lesions identified. IMPRESSION: 1. Distended gallbladder filled with numerous gallstones with a large gallstone in the neck of the gallbladder as described. Small amount of pericholecystic fluid. Findings could indicate early acute calculus cholecystitis. HIDA scan may be useful for further assessment as clinically warranted. 2. Minimal pancreatic edema. Correlate with pancreatic enzymes. No ductal dilation or peripancreatic fluid. 3. Variant hepatic ductal anatomy as described above. Electronically Signed: By: Donzetta Kohut M.D. On: 05/11/2019 08:08     Scheduled Meds: . fluticasone  2 spray Each Nare Daily   Continuous Infusions: . lactated ringers Stopped (05/11/19 0650)  . piperacillin-tazobactam (ZOSYN)  IV 3.375 g (05/11/19 0651)     LOS: 1 day   Time Spent in minutes   45 minutes  Aloise Copus D.O. on 05/11/2019 at 8:18 AM  Between 7am to 7pm - Please see pager noted on [email or web address]  After 7pm go to [email or web address]  And look for the night coverage person covering for me after hours  [facility] Office  [phone]

## 2019-05-11 NOTE — Progress Notes (Signed)
New Admission Note:   Arrival Method: Bed  Mental Orientation: Alert and oriented  Telemetry: Box 21  Assessment: Completed Skin: intact  IV: Right AC  Pain: 0/10  Tubes: none  Safety Measures: Safety Fall Prevention Plan has been given, discussed and signed Admission: Completed [facility] Orientation: Patient has been orientated to the room, unit and staff.  Family: none   Orders have been reviewed and implemented. Will continue to monitor the patient. Call light has been placed within reach and bed alarm has been activated.   Marvie Brevik RN [facility] Phone: [phone]

## 2019-05-11 NOTE — Consult Note (Signed)
[facility] Surgery Consult Note  Jasmine [name] 1980/10/08  [id number].    Requesting MD: Cristal Ford Chief Complaint/Reason for Consult: gallstone pancreatitis  HPI:  Jasmine [name] is a 39yo female with no significant PMH who was admitted to [facility] yesterday with acute pancreatitis. States that over the last 2 weeks she has been experiencing intermittent abdominal pain. Pain initially was mild, epigastric/lower chest, and worse after PO intake. 3-4 days ago the pain became more severe and frequent. Associated with nausea and vomiting. Denies fever, chills, dysuria. She went to urgent care on Monday who discharged her with prilosec for suspected GERD. This did not help so she came to the ED.  ED workup included CT scan which showed acute mild pancreatitis. WBC 6.9, Lipase 3181, AST 216, ALT 180, Alk phos 108, Tbili 1.4. MRCP obtained this morning which shows distended gallbladder filled with numerous gallstones with a large gallstone in the neck of the gallbladder, small amount of pericholecystic fluid. LFTs are trending down this morning, and patient states that abdominal pain is improving.  Abdominal surgical history: ex lap left salpingectomy 2012 for ectopic pregnancy Anticoagulants: none Nonsmoker Drinks alcohol rarely on occasion Employment: Pharmacist, hospital at [employer]  Review of Systems  Constitutional: Positive for weight loss.       50lb last year, intended  HENT: Negative.   Eyes: Negative.   Respiratory: Negative.   Cardiovascular: Positive for chest pain.  Gastrointestinal: Positive for abdominal pain, nausea and vomiting. Negative for constipation and diarrhea.  Genitourinary: Negative.   Musculoskeletal: Negative.   Skin: Negative.   Neurological: Negative.    All systems reviewed and otherwise negative except for as above  Family History  Problem Relation Age of Onset  . Cancer Father   . Diabetes Brother     Past Medical History:  Diagnosis Date  . Active labor  01/07/2014  . Allergic rhinitis   . Asthma   . Postpartum care following vaginal delivery (11/14) 01/08/2014  . Thrombocytopenia ([place]) 12/19/2013    Past Surgical History:  Procedure Laterality Date  . tubal resection      Social History:  reports that she has never smoked. She has never used smokeless tobacco. She reports that she does not drink alcohol or use drugs.  Allergies: No Known Allergies  (Not in a hospital admission)   Prior to Admission medications   Medication Sig Start Date End Date Taking? Authorizing Provider  albuterol (PROAIR HFA) 108 (90 Base) MCG/ACT inhaler Inhale 2 puffs into the lungs every 4 (four) hours as needed for wheezing or shortness of breath. 07/27/17  Yes [place]ique, Betty G, MD  fluticasone (FLONASE) 50 MCG/ACT nasal spray SPRAY 2 SPRAYS INTO EACH NOSTRIL EVERY DAY Patient taking differently: Place 2 sprays into both nostrils daily.  10/23/17  Yes [place], Betty G, MD  KARIVA 0.15-0.02/0.01 MG (21/5) tablet Take 1 tablet by mouth daily. 07/02/17  Yes [provider]  omeprazole (PRILOSEC) 20 MG capsule Take 1 capsule (20 mg total) by mouth daily. 05/09/19  Yes Faustino Congress, NP  OZEMPIC, 1 MG/DOSE, 2 MG/1.5ML SOPN Inject 0.75 mLs into the skin once a week. 04/28/19  Yes [provider]    Blood pressure 138/71, pulse 98, temperature 98.2 F (36.8 C), temperature source Oral, resp. rate 14, SpO2 100 %, currently breastfeeding. Physical Exam: General: pleasant, WD/WN black female who is laying in bed in NAD HEENT: head is normocephalic, atraumatic.  Sclera are noninjected.  PERRL.  Ears and nose without any masses or lesions.  Mouth is pink and moist. Dentition fair Heart: regular, rate, and rhythm.  Normal s1,s2. No obvious murmurs, gallops, or rubs noted.  Palpable pedal pulses bilaterally  Lungs: CTAB, no wheezes, rhonchi, or rales noted.  Respiratory effort nonlabored Abd: well healed vertical incision distal to umbilicus, soft, ND,  +BS, no masses, hernias, or organomegaly. Mild epigastric TTP without rebound or guarding MS: no BUE/BLE edema, calves soft and nontender Skin: warm and dry with no masses, lesions, or rashes Psych: A&Ox4 with an appropriate affect Neuro: cranial nerves grossly intact, equal strength in BUE/BLE bilaterally, normal speech, thought process intact  Results for orders placed or performed during the hospital encounter of 05/10/19 (from the past 48 hour(s))  Lipase, blood     Status: Abnormal   Collection Time: 05/10/19  4:13 PM  Result Value Ref Range   Lipase 3,181 (H) 11 - 51 U/L    Comment: RESULTS CONFIRMED BY MANUAL DILUTION Performed at [facility], [address] [place], [place] [address]   Comprehensive metabolic panel     Status: Abnormal   Collection Time: 05/10/19  4:13 PM  Result Value Ref Range   Sodium 138 135 - 145 mmol/L   Potassium 3.2 (L) 3.5 - 5.1 mmol/L   Chloride 105 98 - 111 mmol/L   CO2 24 22 - 32 mmol/L   Glucose, Bld 96 70 - 99 mg/dL    Comment: Glucose reference range applies only to samples taken after fasting for at least 8 hours.   BUN 11 6 - 20 mg/dL   Creatinine, Ser 0.90 0.44 - 1.00 mg/dL   Calcium 8.8 (L) 8.9 - 10.3 mg/dL   Total Protein 6.9 6.5 - 8.1 g/dL   Albumin 3.7 3.5 - 5.0 g/dL   AST 216 (H) 15 - 41 U/L   ALT 180 (H) 0 - 44 U/L   Alkaline Phosphatase 108 38 - 126 U/L   Total Bilirubin 1.4 (H) 0.3 - 1.2 mg/dL   GFR calc non Af Amer >60 >60 mL/min   GFR calc Af Amer >60 >60 mL/min   Anion gap 9 5 - 15    Comment: Performed at [facility] [address] [place], [place] [address]  CBC     Status: None   Collection Time: 05/10/19  4:13 PM  Result Value Ref Range   WBC 6.9 4.0 - 10.5 K/uL   RBC 4.62 3.87 - 5.11 MIL/uL   Hemoglobin 13.5 12.0 - 15.0 g/dL   HCT 43.1 36.0 - 46.0 %   MCV 93.3 80.0 - 100.0 fL   MCH 29.2 26.0 - 34.0 pg   MCHC 31.3 30.0 - 36.0 g/dL   RDW 13.2 11.5 - 15.5 %   Platelets 160 150 - 400 K/uL   nRBC  0.0 0.0 - 0.2 %    Comment: Performed at [facility], [place] [address] [place] [address], [place], [place] [address]  Troponin I (High Sensitivity)     Status: None   Collection Time: 05/10/19  4:13 PM  Result Value Ref Range   Troponin I (High Sensitivity) <2 <18 ng/L    Comment: (NOTE) Elevated high sensitivity troponin I (hsTnI) values and significant  changes across serial measurements may suggest ACS but many other  chronic and acute conditions are known to elevate hsTnI results.  Refer to the Links section for chest pain algorithms and additional  guidance. Performed at [facility], [place] [address] [place],  [address]  I-Stat beta hCG blood, ED     Status: None   Collection Time: 05/10/19  4:18 PM  Result Value Ref Range   I-stat hCG, quantitative <5.0 <5 mIU/mL   Comment 3            Comment:   GEST. AGE      CONC.  (mIU/mL)   <=1 WEEK        5 - 50     2 WEEKS       50 - 500     3 WEEKS       100 - 10,000     4 WEEKS     1,000 - 30,000        FEMALE AND NON-PREGNANT FEMALE:     LESS THAN 5 mIU/mL   Urinalysis, Routine w reflex microscopic     Status: Abnormal   Collection Time: 05/10/19  8:04 PM  Result Value Ref Range   Color, Urine AMBER (A) YELLOW    Comment: BIOCHEMICALS MAY BE AFFECTED BY COLOR   APPearance CLEAR CLEAR   Specific Gravity, Urine 1.029 1.005 - 1.030   pH 5.0 5.0 - 8.0   Glucose, UA NEGATIVE NEGATIVE mg/dL   Hgb urine dipstick NEGATIVE NEGATIVE   Bilirubin Urine NEGATIVE NEGATIVE   Ketones, ur 5 (A) NEGATIVE mg/dL   Protein, ur 30 (A) NEGATIVE mg/dL   Nitrite NEGATIVE NEGATIVE   Leukocytes,Ua NEGATIVE NEGATIVE   RBC / HPF 0-5 0 - 5 RBC/hpf   WBC, UA 0-5 0 - 5 WBC/hpf   Bacteria, UA RARE (A) NONE SEEN   Squamous Epithelial / LPF 0-5 0 - 5   Mucus PRESENT     Comment: Performed at [facility], [place] [address], [place], [place] [address]  Troponin I (High Sensitivity)     Status: None   Collection Time: 05/10/19  9:19 PM  Result Value  Ref Range   Troponin I (High Sensitivity) 3 <18 ng/L    Comment: (NOTE) Elevated high sensitivity troponin I (hsTnI) values and significant  changes across serial measurements may suggest ACS but many other  chronic and acute conditions are known to elevate hsTnI results.  Refer to the "Links" section for chest pain algorithms and additional  guidance. Performed at [facility], [place] [address], [place], [place] [address]   SARS CORONAVIRUS 2 (TAT 6-24 HRS) Nasopharyngeal Nasopharyngeal Swab     Status: None   Collection Time: 05/10/19 11:37 PM   Specimen: Nasopharyngeal Swab  Result Value Ref Range   SARS Coronavirus 2 NEGATIVE NEGATIVE    Comment: (NOTE) SARS-CoV-2 target nucleic acids are NOT DETECTED. The SARS-CoV-2 RNA is generally detectable in upper and lower respiratory specimens during the acute phase of infection. Negative results do not preclude SARS-CoV-2 infection, do not rule out co-infections with other pathogens, and should not be used as the sole basis for treatment or other patient management decisions. Negative results must be combined with clinical observations, patient history, and epidemiological information. The expected result is Negative. Fact Sheet for Patients: SugarRoll.be Fact Sheet for Healthcare Providers: https://www.woods-mathews.com/ This test is not yet approved or cleared by the [place] FDA and  has been authorized for detection and/or diagnosis of SARS-CoV-2 by FDA under an Emergency Use Authorization (EUA). This EUA will remain  in effect (meaning this test can be used) for the duration of the COVID-19 declaration under Section 56 4(b)(1) of the Act, 21 U.S.C. section 360bbb-3(b)(1), unless the authorization is  terminated or revoked sooner. Performed at [facility], [place] [address] [place], [place] [address]   HIV Antibody (routine testing w rflx)     Status: None   Collection  Time: 05/11/19 12:46 AM  Result Value Ref Range   HIV Screen 4th Generation wRfx NON REACTIVE NON REACTIVE    Comment: Performed at [facility] [address] [place] [address], [place], [place] [address]  Triglycerides     Status: None   Collection Time: 05/11/19 12:46 AM  Result Value Ref Range   Triglycerides 42 <150 mg/dL    Comment: Performed at [facility] [address] [place], [place] [address]  Hepatic function panel     Status: Abnormal   Collection Time: 05/11/19  5:23 AM  Result Value Ref Range   Total Protein 6.4 (L) 6.5 - 8.1 g/dL   Albumin 3.4 (L) 3.5 - 5.0 g/dL   AST 108 (H) 15 - 41 U/L   ALT 168 (H) 0 - 44 U/L   Alkaline Phosphatase 91 38 - 126 U/L   Total Bilirubin 0.7 0.3 - 1.2 mg/dL   Bilirubin, Direct 0.2 0.0 - 0.2 mg/dL   Indirect Bilirubin 0.5 0.3 - 0.9 mg/dL    Comment: Performed at [facility] [address] [place], [place] [address]  Basic metabolic panel     Status: Abnormal   Collection Time: 05/11/19  5:23 AM  Result Value Ref Range   Sodium 137 135 - 145 mmol/L   Potassium 3.2 (L) 3.5 - 5.1 mmol/L   Chloride 105 98 - 111 mmol/L   CO2 20 (L) 22 - 32 mmol/L   Glucose, Bld 78 70 - 99 mg/dL    Comment: Glucose reference range applies only to samples taken after fasting for at least 8 hours.   BUN 9 6 - 20 mg/dL   Creatinine, Ser 0.84 0.44 - 1.00 mg/dL   Calcium 8.4 (L) 8.9 - 10.3 mg/dL   GFR calc non Af Amer >60 >60 mL/min   GFR calc Af Amer >60 >60 mL/min   Anion gap 12 5 - 15    Comment: Performed at [facility] [address] [place], [place] [address]  CBC with Differential/Platelet     Status: None   Collection Time: 05/11/19  5:23 AM  Result Value Ref Range   WBC 6.4 4.0 - 10.5 K/uL   RBC 4.31 3.87 - 5.11 MIL/uL   Hemoglobin 12.6 12.0 - 15.0 g/dL   HCT 39.5 36.0 - 46.0 %   MCV 91.6 80.0 - 100.0 fL   MCH 29.2 26.0 - 34.0 pg   MCHC 31.9 30.0 - 36.0 g/dL   RDW 13.1 11.5 - 15.5 %   Platelets 151 150 - 400 K/uL   nRBC 0.0  0.0 - 0.2 %   Neutrophils Relative % 68 %   Neutro Abs 4.4 1.7 - 7.7 K/uL   Lymphocytes Relative 22 %   Lymphs Abs 1.4 0.7 - 4.0 K/uL   Monocytes Relative 7 %   Monocytes Absolute 0.4 0.1 - 1.0 K/uL   Eosinophils Relative 2 %   Eosinophils Absolute 0.1 0.0 - 0.5 K/uL   Basophils Relative 1 %   Basophils Absolute 0.0 0.0 - 0.1 K/uL   Immature Granulocytes 0 %   Abs Immature Granulocytes 0.01 0.00 - 0.07 K/uL    Comment: Performed at [facility], [address], [place], [place] [address]   CT Abdomen  Pelvis W Contrast  Result Date: 05/10/2019 CLINICAL DATA:  Acute abdominal pain. EXAM: CT ABDOMEN AND PELVIS WITH CONTRAST TECHNIQUE: Multidetector CT imaging of the abdomen and pelvis was performed using the standard protocol following bolus administration of intravenous contrast. Automatic exposure control utilized. CONTRAST:  157m OMNIPAQUE IOHEXOL 300 MG/ML  SOLN COMPARISON:  None. FINDINGS: Lower chest: Normal. Hepatobiliary: None mild periportal edema. Partial characterization of an 18 mm central hypoenhancing lesion with some nodular peripheral enhancement and vascularity which would favor a benign hemangioma etiology. Patent hepatic and portal veins. Normal hepatic size and contours. Normal gallbladder and common bile duct. Pancreas: Mild heterogeneity of the pancreatic parenchyma with effacement of the normal lobular contours concerning for a subtle acute pancreatitis. Backspace no sequela of chronic pancreatitis. Paucity of mesenteric fat, including in the peripancreatic region. No obvious free fluid, adenopathy or pseudocyst. Patent splenic vein. No evidence for pancreatic necrosis. Spleen: Normal. Adrenals/Urinary Tract: No apparent abnormality. Stomach/Bowel: Normal appendix, axial series 3, image 58. Moderate stool burden without bowel obstruction or apparent bowel wall thickening. Decompressed stomach without an apparent abnormality. Vascular/Lymphatic: No adenopathy. 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 Normal caliber of the abdominal aorta without calcified atherosclerosis. Reproductive: Normally sized uterus with possible small uterine fibroid in. A left ovarian 13 mm benign functional cyst. Otherwise normal appearance of both ovaries. Other: No free intraperitoneal fluid or air. Musculoskeletal: Mild degenerative change and acquired disc disease at the lumbosacral junction with grade 1 retrolisthesis of L5 on S1 that is likely degenerative. Mild left sacroiliitis. IMPRESSION: Imaging findings that would support a clinical diagnosis of an acute mild pancreatitis without portal vein or splenic vein thrombosis or pancreatic necrosis. No pseudocyst or sequela of chronic pancreatitis. Mild periportal edema with normal gallbladder and common bile duct. An 18 mm hypoenhancing hepatic lesion with peripheral nodular enhancement in vascularity, probably a benign hemangioma. No further MR characterization is indicated. Given the elevated hepatic enzymes and abdominal pain, surveillance ultrasonography could be considered in 3-6 months. Aortic Atherosclerosis (ICD10-I70.0). Electronically Signed   By: RRevonda Humphrey  On: 05/10/2019 23:09   MR ABDOMEN MRCP WO CONTRAST  Addendum Date: 05/11/2019   ADDENDUM REPORT: 05/11/2019 08:12 ADDENDUM: Presumed hepatic hemangioma as described. Electronically Signed   By: GZetta BillsM.D.   On: 05/11/2019 08:12   Result Date: 05/11/2019 CLINICAL DATA:  Abdominal pain, suspected pancreatitis. EXAM: MRI ABDOMEN WITHOUT CONTRAST  (INCLUDING MRCP) TECHNIQUE: Multiplanar multisequence MR imaging of the abdomen was performed. Heavily T2-weighted images of the biliary and pancreatic ducts were obtained, and three-dimensional MRCP images were rendered by post processing. COMPARISON:  CT evaluation of 05/10/2019 FINDINGS: Lower chest: Incidental imaging of the lung bases is unremarkable, limited  assessment on MRI. Hepatobiliary: Presumed hemangioma in the right hepatic lobe. Similar to previous CT. No signs of biliary ductal dilation. Common bile duct is normal, without filling defect. Variant hepatic ductal anatomy with posterior division right hepatic duct draining into the biliary confluence from the superior liver and inferior right hepatic duct draining just below the biliary confluence but well above the cystic duct confluence. Numerous filling defects in the gallbladder and cystic duct, a large stone in the neck of the gallbladder measuring approximately 1-1.2 cm. Small amount of pericholecystic fluid. Contrast was not administered, this limits assessment of gallbladder wall. Pancreas: Little if any pancreatic edema. No focal peripancreatic fluid. Spleen:  Normal size spleen, no focal lesion. Adrenals/Urinary Tract: Normal appearance of  the adrenal glands. Kidneys with smooth renal contours. No signs of hydronephrosis. Stomach/Bowel: Limited assessment of the gastrointestinal tract on MRI. No signs of bowel obstruction or acute bowel process Vascular/Lymphatic: Lack of contrast limits assessment. Vascular structures are grossly patent. Other:  None Musculoskeletal: No suspicious bone lesions identified. IMPRESSION: 1. Distended gallbladder filled with numerous gallstones with a large gallstone in the neck of the gallbladder as described. Small amount of pericholecystic fluid. Findings could indicate early acute calculus cholecystitis. HIDA scan may be useful for further assessment as clinically warranted. 2. Minimal pancreatic edema. Correlate with pancreatic enzymes. No ductal dilation or peripancreatic fluid. 3. Variant hepatic ductal anatomy as described above. Electronically Signed: By: Zetta Bills M.D. On: 05/11/2019 08:08      Assessment/Plan Hepatic lesion - likely hemangioma  Acute pancreatitis Elevated LFTs Possible early acute cholecystitis - Patient with suspected gallstone  pancreatitis. Agree with medical admission for IVF, NPO, symptom management. MRCP does not show choledocholithiasis and LFTs are trending down.  Will plan for laparoscopic cholecystectomy once pancreatitis resolves. Continue IV zosyn. Plan to repeat lipase, CMP, CBC in AM. Surgery may be as early as tomorrow depending on clinical picture.  ID - zosyn 3/17>> FEN - IVF, NPO VTE - SCDs, ok for chemical DVT prophylaxis from surgical standpoint Foley - none Follow up - TBD  [place], [facility] Surgery 05/11/2019, 9:19 AM Please see Amion for pager number during day hours 7:00am-4:30pm

## 2019-05-12 ENCOUNTER — Inpatient Hospital Stay (HOSPITAL_COMMUNITY): Payer: BC Managed Care – PPO | Admitting: Certified Registered Nurse Anesthetist

## 2019-05-12 ENCOUNTER — Encounter (HOSPITAL_COMMUNITY): Payer: Self-pay | Admitting: Internal Medicine

## 2019-05-12 ENCOUNTER — Encounter (HOSPITAL_COMMUNITY)
Admission: EM | Disposition: A | Discharge status: Home / Self Care | Payer: Self-pay | Source: Home / Self Care | Attending: Internal Medicine

## 2019-05-12 ENCOUNTER — Inpatient Hospital Stay (HOSPITAL_COMMUNITY): Payer: BC Managed Care – PPO

## 2019-05-12 DIAGNOSIS — E876 Hypokalemia: Secondary | ICD-10-CM

## 2019-05-12 DIAGNOSIS — R7989 Other specified abnormal findings of blood chemistry: Secondary | ICD-10-CM

## 2019-05-12 DIAGNOSIS — K769 Liver disease, unspecified: Secondary | ICD-10-CM

## 2019-05-12 HISTORY — PX: CHOLECYSTECTOMY: SHX55

## 2019-05-12 LAB — COMPREHENSIVE METABOLIC PANEL
ALT: 101 U/L — ABNORMAL HIGH (ref 0–44)
AST: 37 U/L (ref 15–41)
Albumin: 3.1 g/dL — ABNORMAL LOW (ref 3.5–5.0)
Alkaline Phosphatase: 74 U/L (ref 38–126)
Anion gap: 11 (ref 5–15)
BUN: 6 mg/dL (ref 6–20)
CO2: 23 mmol/L (ref 22–32)
Calcium: 8.6 mg/dL — ABNORMAL LOW (ref 8.9–10.3)
Chloride: 107 mmol/L (ref 98–111)
Creatinine, Ser: 0.89 mg/dL (ref 0.44–1.00)
GFR calc Af Amer: >60 mL/min (ref >60)
GFR calc non Af Amer: >60 mL/min (ref >60)
Glucose, Bld: 104 mg/dL — ABNORMAL HIGH (ref 70–99)
Potassium: 3.5 mmol/L (ref 3.5–5.1)
Sodium: 141 mmol/L (ref 135–145)
Total Bilirubin: 0.5 mg/dL (ref 0.3–1.2)
Total Protein: 5.8 g/dL — ABNORMAL LOW (ref 6.5–8.1)

## 2019-05-12 LAB — CBC
HCT: 35.6 % — ABNORMAL LOW (ref 36.0–46.0)
Hemoglobin: 11.5 g/dL — ABNORMAL LOW (ref 12.0–15.0)
MCH: 29.3 pg (ref 26.0–34.0)
MCHC: 32.3 g/dL (ref 30.0–36.0)
MCV: 90.8 fL (ref 80.0–100.0)
Platelets: 139 10*3/uL — ABNORMAL LOW (ref 150–400)
RBC: 3.92 MIL/uL (ref 3.87–5.11)
RDW: 13.2 % (ref 11.5–15.5)
WBC: 4.5 10*3/uL (ref 4.0–10.5)
nRBC: 0 % (ref 0.0–0.2)

## 2019-05-12 LAB — LIPASE, BLOOD: Lipase: 57 U/L — ABNORMAL HIGH (ref 11–51)

## 2019-05-12 SURGERY — LAPAROSCOPIC CHOLECYSTECTOMY WITH INTRAOPERATIVE CHOLANGIOGRAM
Anesthesia: General | Site: Abdomen

## 2019-05-12 MED ORDER — LIDOCAINE 2% (20 MG/ML) 5 ML SYRINGE
INTRAMUSCULAR | Status: DC | PRN
  Administered 2019-05-12: 40 mg via INTRAVENOUS

## 2019-05-12 MED ORDER — ESMOLOL HCL 100 MG/10ML IV SOLN
INTRAVENOUS | Status: AC
  Filled 2019-05-12: qty 10

## 2019-05-12 MED ORDER — MIDAZOLAM HCL 5 MG/5ML IJ SOLN
INTRAMUSCULAR | Status: DC | PRN
  Administered 2019-05-12: 2 mg via INTRAVENOUS

## 2019-05-12 MED ORDER — LIDOCAINE 2% (20 MG/ML) 5 ML SYRINGE
INTRAMUSCULAR | Status: AC
  Filled 2019-05-12: qty 5

## 2019-05-12 MED ORDER — MIDAZOLAM HCL 2 MG/2ML IJ SOLN
INTRAMUSCULAR | Status: AC
  Filled 2019-05-12: qty 2

## 2019-05-12 MED ORDER — POLYETHYLENE GLYCOL 3350 17 G PO PACK: 17.0000 g | PACK | Freq: Every day | ORAL | Status: DC | PRN

## 2019-05-12 MED ORDER — BUPIVACAINE HCL (PF) 0.25 % IJ SOLN
INTRAMUSCULAR | Status: AC
  Filled 2019-05-12: qty 30

## 2019-05-12 MED ORDER — PHENYLEPHRINE 40 MCG/ML (10ML) SYRINGE FOR IV PUSH (FOR BLOOD PRESSURE SUPPORT)
PREFILLED_SYRINGE | INTRAVENOUS | Status: AC
  Filled 2019-05-12: qty 10

## 2019-05-12 MED ORDER — 0.9 % SODIUM CHLORIDE (POUR BTL) OPTIME
TOPICAL | Status: DC | PRN
  Administered 2019-05-12: 1000 mL

## 2019-05-12 MED ORDER — DIPHENHYDRAMINE HCL 50 MG/ML IJ SOLN: 25.0000 mg | Freq: Four times a day (QID) | INTRAMUSCULAR | Status: DC | PRN

## 2019-05-12 MED ORDER — ENOXAPARIN SODIUM 40 MG/0.4ML ~~LOC~~ SOLN
40.0000 mg | SUBCUTANEOUS | Status: DC
  Administered 2019-05-13 – 2019-05-14 (×2): 40 mg via SUBCUTANEOUS
  Filled 2019-05-12 (×2): qty 0.4

## 2019-05-12 MED ORDER — CELECOXIB 200 MG PO CAPS
200.0000 mg | ORAL_CAPSULE | Freq: Two times a day (BID) | ORAL | Status: DC
  Administered 2019-05-12 – 2019-05-13 (×4): 200 mg via ORAL
  Filled 2019-05-12 (×5): qty 1

## 2019-05-12 MED ORDER — ONDANSETRON HCL 4 MG/2ML IJ SOLN: 4.0000 mg | Freq: Four times a day (QID) | INTRAMUSCULAR | Status: DC | PRN

## 2019-05-12 MED ORDER — MORPHINE SULFATE (PF) 2 MG/ML IV SOLN
2.0000 mg | INTRAVENOUS | Status: DC | PRN
  Administered 2019-05-12 (×2): 2 mg via INTRAVENOUS
  Filled 2019-05-12 (×2): qty 1

## 2019-05-12 MED ORDER — OXYCODONE HCL 5 MG PO TABS
5.0000 mg | ORAL_TABLET | ORAL | Status: DC | PRN
  Administered 2019-05-12 – 2019-05-14 (×7): 5 mg via ORAL
  Filled 2019-05-12 (×7): qty 1

## 2019-05-12 MED ORDER — DIPHENHYDRAMINE HCL 25 MG PO CAPS: 25.0000 mg | ORAL_CAPSULE | Freq: Four times a day (QID) | ORAL | Status: DC | PRN

## 2019-05-12 MED ORDER — METHOCARBAMOL 500 MG PO TABS
500.0000 mg | ORAL_TABLET | Freq: Four times a day (QID) | ORAL | Status: DC | PRN
  Administered 2019-05-14: 500 mg via ORAL
  Filled 2019-05-12: qty 1

## 2019-05-12 MED ORDER — OXYCODONE HCL 5 MG/5ML PO SOLN: 5.0000 mg | Freq: Once | ORAL | Status: DC | PRN

## 2019-05-12 MED ORDER — BUPIVACAINE HCL 0.25 % IJ SOLN
INTRAMUSCULAR | Status: DC | PRN
  Administered 2019-05-12: 30 mL

## 2019-05-12 MED ORDER — FENTANYL CITRATE (PF) 250 MCG/5ML IJ SOLN
INTRAMUSCULAR | Status: AC
  Filled 2019-05-12: qty 5

## 2019-05-12 MED ORDER — ROCURONIUM BROMIDE 10 MG/ML (PF) SYRINGE
PREFILLED_SYRINGE | INTRAVENOUS | Status: AC
  Filled 2019-05-12: qty 10

## 2019-05-12 MED ORDER — SIMETHICONE 80 MG PO CHEW: 40.0000 mg | CHEWABLE_TABLET | Freq: Four times a day (QID) | ORAL | Status: DC | PRN

## 2019-05-12 MED ORDER — SODIUM CHLORIDE 0.9 % IR SOLN
Status: DC | PRN
  Administered 2019-05-12: 1

## 2019-05-12 MED ORDER — FENTANYL CITRATE (PF) 250 MCG/5ML IJ SOLN
INTRAMUSCULAR | Status: DC | PRN
  Administered 2019-05-12 (×2): 50 ug via INTRAVENOUS
  Administered 2019-05-12: 100 ug via INTRAVENOUS
  Administered 2019-05-12: 50 ug via INTRAVENOUS

## 2019-05-12 MED ORDER — LACTATED RINGERS IV SOLN: INTRAVENOUS | Status: DC

## 2019-05-12 MED ORDER — SODIUM CHLORIDE 0.9 % IV SOLN
INTRAVENOUS | Status: DC | PRN
  Administered 2019-05-12: 5 mL

## 2019-05-12 MED ORDER — ONDANSETRON HCL 4 MG/2ML IJ SOLN
INTRAMUSCULAR | Status: DC | PRN
  Administered 2019-05-12: 4 mg via INTRAVENOUS

## 2019-05-12 MED ORDER — FENTANYL CITRATE (PF) 100 MCG/2ML IJ SOLN
25.0000 ug | INTRAMUSCULAR | Status: DC | PRN
  Administered 2019-05-12 (×2): 50 ug via INTRAVENOUS

## 2019-05-12 MED ORDER — ROCURONIUM BROMIDE 10 MG/ML (PF) SYRINGE
PREFILLED_SYRINGE | INTRAVENOUS | Status: DC | PRN
  Administered 2019-05-12: 50 mg via INTRAVENOUS
  Administered 2019-05-12: 5 mg via INTRAVENOUS

## 2019-05-12 MED ORDER — ESMOLOL HCL 100 MG/10ML IV SOLN
INTRAVENOUS | Status: DC | PRN
  Administered 2019-05-12 (×4): 20 mg via INTRAVENOUS

## 2019-05-12 MED ORDER — PHENYLEPHRINE 40 MCG/ML (10ML) SYRINGE FOR IV PUSH (FOR BLOOD PRESSURE SUPPORT)
PREFILLED_SYRINGE | INTRAVENOUS | Status: DC | PRN
  Administered 2019-05-12: 40 ug via INTRAVENOUS

## 2019-05-12 MED ORDER — OXYCODONE HCL 5 MG PO TABS: 5.0000 mg | ORAL_TABLET | Freq: Once | ORAL | Status: DC | PRN

## 2019-05-12 MED ORDER — FENTANYL CITRATE (PF) 100 MCG/2ML IJ SOLN
INTRAMUSCULAR | Status: AC
  Filled 2019-05-12: qty 2

## 2019-05-12 MED ORDER — PROPOFOL 10 MG/ML IV BOLUS
INTRAVENOUS | Status: DC | PRN
  Administered 2019-05-12: 160 mg via INTRAVENOUS
  Administered 2019-05-12: 40 mg via INTRAVENOUS

## 2019-05-12 MED ORDER — SUGAMMADEX SODIUM 200 MG/2ML IV SOLN
INTRAVENOUS | Status: DC | PRN
  Administered 2019-05-12: 200 mg via INTRAVENOUS

## 2019-05-12 MED ORDER — MUPIROCIN 2 % EX OINT
TOPICAL_OINTMENT | Freq: Two times a day (BID) | CUTANEOUS | Status: DC
  Filled 2019-05-12: qty 22

## 2019-05-12 MED ORDER — PROPOFOL 10 MG/ML IV BOLUS
INTRAVENOUS | Status: AC
  Filled 2019-05-12: qty 20

## 2019-05-12 MED ORDER — DEXAMETHASONE SODIUM PHOSPHATE 10 MG/ML IJ SOLN
INTRAMUSCULAR | Status: DC | PRN
  Administered 2019-05-12: 4 mg via INTRAVENOUS

## 2019-05-12 MED ORDER — ACETAMINOPHEN 650 MG RE SUPP: 650.0000 mg | Freq: Four times a day (QID) | RECTAL | Status: DC | PRN

## 2019-05-12 MED ORDER — ACETAMINOPHEN 325 MG PO TABS: 650.0000 mg | ORAL_TABLET | Freq: Four times a day (QID) | ORAL | Status: DC | PRN

## 2019-05-12 MED ORDER — STERILE WATER FOR IRRIGATION IR SOLN
Status: DC | PRN
  Administered 2019-05-12: 1000 mL

## 2019-05-12 MED ORDER — DOCUSATE SODIUM 100 MG PO CAPS
100.0000 mg | ORAL_CAPSULE | Freq: Two times a day (BID) | ORAL | Status: DC
  Administered 2019-05-12 – 2019-05-13 (×3): 100 mg via ORAL
  Filled 2019-05-12 (×3): qty 1

## 2019-05-12 SURGICAL SUPPLY — 47 items
ADH SKN CLS APL DERMABOND .7 (GAUZE/BANDAGES/DRESSINGS) ×1
APL PRP STRL LF DISP 70% ISPRP (MISCELLANEOUS) ×1
APPLIER CLIP ROT 10 11.4 M/L (STAPLE)
APR CLP MED LRG 11.4X10 (STAPLE)
BAG SPEC RTRVL 10 TROC 200 (ENDOMECHANICALS) ×1
BLADE CLIPPER SURG (BLADE) IMPLANT
CANISTER SUCT 3000ML PPV (MISCELLANEOUS) ×2 IMPLANT
CATH CHOLANG 76X19 KUMAR (CATHETERS) ×2 IMPLANT
CHLORAPREP W/TINT 26 (MISCELLANEOUS) ×2 IMPLANT
CLIP APPLIE ROT 10 11.4 M/L (STAPLE) IMPLANT
CLIP VESOLOCK MED LG 6/CT (CLIP) IMPLANT
COVER MAYO STAND STRL (DRAPES) ×2 IMPLANT
COVER SURGICAL LIGHT HANDLE (MISCELLANEOUS) ×2 IMPLANT
COVER WAND RF STERILE (DRAPES) ×2 IMPLANT
DERMABOND ADVANCED (GAUZE/BANDAGES/DRESSINGS) ×1
DERMABOND ADVANCED .7 DNX12 (GAUZE/BANDAGES/DRESSINGS) ×1 IMPLANT
DRAPE C-ARM 42X120 X-RAY (DRAPES) ×2 IMPLANT
ELECT REM PT RETURN 9FT ADLT (ELECTROSURGICAL) ×2
ELECTRODE REM PT RTRN 9FT ADLT (ELECTROSURGICAL) ×1 IMPLANT
GLOVE BIOGEL PI IND STRL 7.0 (GLOVE) ×1 IMPLANT
GLOVE BIOGEL PI INDICATOR 7.0 (GLOVE) ×1
GLOVE SURG SS PI 7.0 STRL IVOR (GLOVE) ×2 IMPLANT
GOWN STRL REUS W/ TWL LRG LVL3 (GOWN DISPOSABLE) ×3 IMPLANT
GOWN STRL REUS W/TWL LRG LVL3 (GOWN DISPOSABLE) ×6
GRASPER SUT TROCAR 14GX15 (MISCELLANEOUS) ×2 IMPLANT
KIT BASIN OR (CUSTOM PROCEDURE TRAY) ×2 IMPLANT
KIT TURNOVER KIT B (KITS) ×2 IMPLANT
NEEDLE 22X1 1/2 (OR ONLY) (NEEDLE) ×2 IMPLANT
NS IRRIG 1000ML POUR BTL (IV SOLUTION) ×2 IMPLANT
PAD ARMBOARD 7.5X6 YLW CONV (MISCELLANEOUS) ×2 IMPLANT
POUCH RETRIEVAL ECOSAC 10 (ENDOMECHANICALS) ×1 IMPLANT
POUCH RETRIEVAL ECOSAC 10MM (ENDOMECHANICALS) ×2
SCISSORS LAP 5X35 DISP (ENDOMECHANICALS) ×2 IMPLANT
SET CHOLANGIOGRAPH 5 50 .035 (SET/KITS/TRAYS/PACK) ×1 IMPLANT
SET IRRIG TUBING LAPAROSCOPIC (IRRIGATION / IRRIGATOR) ×2 IMPLANT
SET TUBE SMOKE EVAC HIGH FLOW (TUBING) ×2 IMPLANT
SLEEVE ENDOPATH XCEL 5M (ENDOMECHANICALS) ×5 IMPLANT
SPECIMEN JAR SMALL (MISCELLANEOUS) ×2 IMPLANT
STOPCOCK 4 WAY LG BORE MALE ST (IV SETS) ×2 IMPLANT
SUT MNCRL AB 4-0 PS2 18 (SUTURE) ×2 IMPLANT
SUT SILK 3 0 SH 30 (SUTURE) ×2 IMPLANT
TOWEL GREEN STERILE (TOWEL DISPOSABLE) ×2 IMPLANT
TOWEL GREEN STERILE FF (TOWEL DISPOSABLE) ×2 IMPLANT
TRAY LAPAROSCOPIC MC (CUSTOM PROCEDURE TRAY) ×2 IMPLANT
TROCAR XCEL 12X100 BLDLESS (ENDOMECHANICALS) ×2 IMPLANT
TROCAR XCEL NON-BLD 5MMX100MML (ENDOMECHANICALS) ×2 IMPLANT
WATER STERILE IRR 1000ML POUR (IV SOLUTION) ×2 IMPLANT

## 2019-05-12 NOTE — Anesthesia Preprocedure Evaluation (Addendum)
Anesthesia Evaluation  Patient identified by MRN, date of birth, ID band Patient awake    Reviewed: Allergy & Precautions, H&P , NPO status , Patient's Chart, lab work & pertinent test results  Airway Mallampati: II   Neck ROM: full    Dental  (+) Teeth Intact, Dental Advidsory Given   Pulmonary asthma ,    breath sounds clear to auscultation       Cardiovascular negative cardio ROS   Rhythm:regular Rate:Normal     Neuro/Psych    GI/Hepatic Cholelithiasis. pancreatitis   Endo/Other    Renal/GU      Musculoskeletal   Abdominal   Peds  Hematology  (+) Blood dyscrasia, anemia ,   Anesthesia Other Findings   Reproductive/Obstetrics                            Anesthesia Physical Anesthesia Plan  ASA: II  Anesthesia Plan: General   Post-op Pain Management:    Induction: Intravenous  PONV Risk Score and Plan: 3 and Dexamethasone, Ondansetron, Midazolam and Treatment may vary due to age or medical condition  Airway Management Planned: Oral ETT  Additional Equipment:   Intra-op Plan:   Post-operative Plan: Extubation in OR  Informed Consent: I have reviewed the patients History and Physical, chart, labs and discussed the procedure including the risks, benefits and alternatives for the proposed anesthesia with the patient or authorized representative who has indicated his/her understanding and acceptance.       Plan Discussed with: CRNA, Anesthesiologist and Surgeon  Anesthesia Plan Comments:         Anesthesia Quick Evaluation

## 2019-05-12 NOTE — Progress Notes (Signed)
PROGRESS NOTE    Jasmine [name]  YME:[id number] DOB: [birth year] DOA: 05/10/2019 PCP: Jasmine Rakers, MD   Brief Narrative:  HPI on 05/11/2019 by Dr. Midge Minium Jasmine [name] is a 39 y.o. female with no significant past medical history presents to the ER because of abdominal pain.  Patient has been having abdominal pain for last 3 to 4 days mostly in the epigastric area with nausea vomiting denies any diarrhea.  Denies any blood in the vomitus.  Denies any fever chills or chest pain.  Denies drinking alcohol.  Had gone to urgent care was prescribed PPI despite taking which patient was denying pain.  Pain is stabbing in nature nonradiating.  Patient used to be on Ozempic weight loss until 2 months ago and has not taken anything for last 2 months.  Interim history Admitted this morning with abdominal pain.  Found to have elevated lipase of over 3000 as well as LFTs.  General surgery gastroenterology consulted and appreciated.  CT abdomen pelvis showed acute pancreatitis with periportal edema.  MRCP showed gallstones.  Continue IV fluids. Pending surgery. Assessment & Plan   Acute pancreatitis secondary to gallstones -Possibly secondary to gallbladder disease.  Patient denies any chronic alcohol use, only occasional/social drinking. -CT abdomen/pelvis: Acute mild pancreatitis without portal venous splenic vein thrombosis or pancreatic necrosis.  Mild periportal edema. -MRCP: Distended gallbladder filled with numerous gallstones with a large gallstone in the neck of the gallbladder as described.  Small amount of pericholecystic fluid.  Early acute calculus cholecystitis. -Lipase of 3181 on admission, down to 57 today -Patient with elevated LFTs- however trending downward -General surgery consulted and appreciated, planning for possible surgery today -Gastroenterology consulted and appreciated, and will follow at a distance pending results of IOC -Continue IV fluids, n.p.o., antiemetics and pain  control as needed  Elevated LFTs -As above, trending downward -Hepatitis panel unremarkable   Hypokalemia -resolved with replacement -continue to monitor   Hepatic lesion -CT abdomen pelvis showed 18 mm hypoenhancing hepatic lesion with peri peripheral nodular enhancement and vascularity, probably benign hemangioma.  Consider ultrasound in 3 to 6 months.  DVT Prophylaxis  SCDs  Code Status: Full  Family Communication: None at bedside.  Disposition Plan: Admitted from home for abdominal pain.  Found to have acute pancreatitis likely secondary to gallbladder disease.  General surgery and GI consulted- pending intervention today. Disposition home likely within 1-2 days.  Consultants Gastroenterology General surgery  Procedures  MRCP  Antibiotics   Anti-infectives (From admission, onward)   Start     Dose/Rate Route Frequency Ordered Stop   05/11/19 0600  [MAR Hold]  piperacillin-tazobactam (ZOSYN) IVPB 3.375 g     (MAR Hold since Thu 05/12/2019 at 0824.Hold Reason: Transfer to a Procedural area.)   3.375 g 12.5 mL/hr over 240 Minutes Intravenous Every 8 hours 05/11/19 0549        Subjective:   Jasmine [name] seen and examined today.  Patient feels abdominal pain has mildly improved today and wondering if surgery will be today. Denies current.  Denies chest pain or shortness, dizziness or headache.    Objective:   Vitals:   05/11/19 2237 05/12/19 0305 05/12/19 0507 05/12/19 0833  BP: 108/66 108/62 99/69   Pulse: 76 80 92   Resp: 18 18 18    Temp: 98.7 F (37.1 C) 98.6 F (37 C) 98.1 F (36.7 C)   TempSrc: Oral Oral Oral   SpO2: 99% 100% 99%   Weight:    74.1 kg  Height:  5\' 6"  (1.676 m)    Intake/Output Summary (Last 24 hours) at 05/12/2019 0853 Last data filed at 05/12/2019 0550 Gross per 24 hour  Intake 875.83 ml  Output 200 ml  Net 675.83 ml   Filed Weights   05/11/19 1433 05/12/19 0833  Weight: 74.1 kg 74.1 kg   Exam  General: Well developed, well  nourished, NAD, appears stated age  HEENT: NCAT, mucous membranes moist.   Cardiovascular: S1 S2 auscultated, RRR, no murmur  Respiratory: Clear to auscultation bilaterally with equal chest rise  Abdomen: Soft, mildly TTP, nondistended, + bowel sounds  Extremities: warm dry without cyanosis clubbing or edema  Neuro: AAOx3, nonfocal  Psych: Normal affect and demeanor with intact judgement and insight  Data Reviewed: I have personally reviewed following labs and imaging studies  CBC: Recent Labs  Lab 05/10/19 1613 05/11/19 0523 05/12/19 0449  WBC 6.9 6.4 4.5  NEUTROABS  --  4.4  --   HGB 13.5 12.6 11.5*  HCT 43.1 39.5 35.6*  MCV 93.3 91.6 90.8  PLT 160 151 139*   Basic Metabolic Panel: Recent Labs  Lab 05/10/19 1613 05/11/19 0523 05/12/19 0449  NA 138 137 141  K 3.2* 3.2* 3.5  CL 105 105 107  CO2 24 20* 23  GLUCOSE 96 78 104*  BUN 11 9 6   CREATININE 0.90 0.84 0.89  CALCIUM 8.8* 8.4* 8.6*   GFR: Estimated Creatinine Clearance: 88.2 mL/min (by C-G formula based on SCr of 0.89 mg/dL). Liver Function Tests: Recent Labs  Lab 05/10/19 1613 05/11/19 0523 05/12/19 0449  AST 216* 108* 37  ALT 180* 168* 101*  ALKPHOS 108 91 74  BILITOT 1.4* 0.7 0.5  PROT 6.9 6.4* 5.8*  ALBUMIN 3.7 3.4* 3.1*   Recent Labs  Lab 05/10/19 1613 05/12/19 0449  LIPASE 3,181* 57*   No results for input(s): AMMONIA in the last 168 hours. Coagulation Profile: No results for input(s): INR, PROTIME in the last 168 hours. Cardiac Enzymes: No results for input(s): CKTOTAL, CKMB, CKMBINDEX, TROPONINI in the last 168 hours. BNP (last 3 results) No results for input(s): PROBNP in the last 8760 hours. HbA1C: No results for input(s): HGBA1C in the last 72 hours. CBG: No results for input(s): GLUCAP in the last 168 hours. Lipid Profile: Recent Labs    05/11/19 0046  TRIG 42   Thyroid Function Tests: No results for input(s): TSH, T4TOTAL, FREET4, T3FREE, THYROIDAB in the last 72  hours. Anemia Panel: No results for input(s): VITAMINB12, FOLATE, FERRITIN, TIBC, IRON, RETICCTPCT in the last 72 hours. Urine analysis:    Component Value Date/Time   COLORURINE AMBER (A) 05/10/2019 2004   APPEARANCEUR CLEAR 05/10/2019 2004   LABSPEC 1.029 05/10/2019 2004   PHURINE 5.0 05/10/2019 2004   GLUCOSEU NEGATIVE 05/10/2019 2004   HGBUR NEGATIVE 05/10/2019 2004   BILIRUBINUR NEGATIVE 05/10/2019 2004   BILIRUBINUR n 11/20/2011 1558   KETONESUR 5 (A) 05/10/2019 2004   PROTEINUR 30 (A) 05/10/2019 2004   UROBILINOGEN 0.2 11/20/2011 1558   UROBILINOGEN 0.2 09/14/2006 1910   NITRITE NEGATIVE 05/10/2019 2004   LEUKOCYTESUR NEGATIVE 05/10/2019 2004   Sepsis Labs: @LABRCNTIP (procalcitonin:4,lacticidven:4)  ) Recent Results (from the past 240 hour(s))  SARS CORONAVIRUS 2 (TAT 6-24 HRS) Nasopharyngeal Nasopharyngeal Swab     Status: None   Collection Time: 05/10/19 11:37 PM   Specimen: Nasopharyngeal Swab  Result Value Ref Range Status   SARS Coronavirus 2 NEGATIVE NEGATIVE Final    Comment: (NOTE) SARS-CoV-2 target nucleic acids are NOT DETECTED.  The SARS-CoV-2 RNA is generally detectable in upper and lower respiratory specimens during the acute phase of infection. Negative results do not preclude SARS-CoV-2 infection, do not rule out co-infections with other pathogens, and should not be used as the sole basis for treatment or other patient management decisions. Negative results must be combined with clinical observations, patient history, and epidemiological information. The expected result is Negative. Fact Sheet for Patients: SugarRoll.be Fact Sheet for Healthcare Providers: https://www.woods-mathews.com/ This test is not yet approved or cleared by the [place] FDA and  has been authorized for detection and/or diagnosis of SARS-CoV-2 by FDA under an Emergency Use Authorization (EUA). This EUA will remain  in effect  (meaning this test can be used) for the duration of the COVID-19 declaration under Section 56 4(b)(1) of the Act, 21 U.S.C. section 360bbb-3(b)(1), unless the authorization is terminated or revoked sooner. Performed at [facility], [place] [address], [place], [place] [address]   Surgical pcr screen     Status: Abnormal   Collection Time: 05/11/19  3:46 PM   Specimen: Nasal Mucosa; Nasal Swab  Result Value Ref Range Status   MRSA, PCR NEGATIVE NEGATIVE Final   Staphylococcus aureus POSITIVE (A) NEGATIVE Final    Comment: (NOTE) The Xpert SA Assay (FDA approved for NASAL specimens in patients 25 years of age and older), is one component of a comprehensive surveillance program. It is not intended to diagnose infection nor to guide or monitor treatment. Performed at [facility], [place] [address], [place], [place] [address]       Radiology Studies: CT Abdomen Pelvis W Contrast  Result Date: 05/10/2019 CLINICAL DATA:  Acute abdominal pain. EXAM: CT ABDOMEN AND PELVIS WITH CONTRAST TECHNIQUE: Multidetector CT imaging of the abdomen and pelvis was performed using the standard protocol following bolus administration of intravenous contrast. Automatic exposure control utilized. CONTRAST:  136mL OMNIPAQUE IOHEXOL 300 MG/ML  SOLN COMPARISON:  None. FINDINGS: Lower chest: Normal. Hepatobiliary: None mild periportal edema. Partial characterization of an 18 mm central hypoenhancing lesion with some nodular peripheral enhancement and vascularity which would favor a benign hemangioma etiology. Patent hepatic and portal veins. Normal hepatic size and contours. Normal gallbladder and common bile duct. Pancreas: Mild heterogeneity of the pancreatic parenchyma with effacement of the normal lobular contours concerning for a subtle acute pancreatitis. Backspace no sequela of chronic pancreatitis. Paucity of mesenteric fat, including in the peripancreatic region. No obvious free fluid, adenopathy or  pseudocyst. Patent splenic vein. No evidence for pancreatic necrosis. Spleen: Normal. Adrenals/Urinary Tract: No apparent abnormality. Stomach/Bowel: Normal appendix, axial series 3, image 58. Moderate stool burden without bowel obstruction or apparent bowel wall thickening. Decompressed stomach without an apparent abnormality. Vascular/Lymphatic: No adenopathy. 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 Normal caliber of the abdominal aorta without calcified atherosclerosis. Reproductive: Normally sized uterus with possible small uterine fibroid in. A left ovarian 13 mm benign functional cyst. Otherwise normal appearance of both ovaries. Other: No free intraperitoneal fluid or air. Musculoskeletal: Mild degenerative change and acquired disc disease at the lumbosacral junction with grade 1 retrolisthesis of L5 on S1 that is likely degenerative. Mild left sacroiliitis. IMPRESSION: Imaging findings that would support a clinical diagnosis of an acute mild pancreatitis without portal vein or splenic vein thrombosis or pancreatic necrosis. No pseudocyst or sequela of chronic pancreatitis. Mild periportal edema with normal gallbladder and common bile duct. An 18 mm hypoenhancing hepatic lesion with peripheral nodular enhancement  in vascularity, probably a benign hemangioma. No further MR characterization is indicated. Given the elevated hepatic enzymes and abdominal pain, surveillance ultrasonography could be considered in 3-6 months. Aortic Atherosclerosis (ICD10-I70.0). Electronically Signed   By: Laurence Ferrari   On: 05/10/2019 23:09   MR ABDOMEN MRCP WO CONTRAST  Addendum Date: 05/11/2019   ADDENDUM REPORT: 05/11/2019 08:12 ADDENDUM: Presumed hepatic hemangioma as described. Electronically Signed   By: Donzetta Kohut M.D.   On: 05/11/2019 08:12   Result Date: 05/11/2019 CLINICAL DATA:  Abdominal pain, suspected pancreatitis. EXAM: MRI ABDOMEN WITHOUT CONTRAST   (INCLUDING MRCP) TECHNIQUE: Multiplanar multisequence MR imaging of the abdomen was performed. Heavily T2-weighted images of the biliary and pancreatic ducts were obtained, and three-dimensional MRCP images were rendered by post processing. COMPARISON:  CT evaluation of 05/10/2019 FINDINGS: Lower chest: Incidental imaging of the lung bases is unremarkable, limited assessment on MRI. Hepatobiliary: Presumed hemangioma in the right hepatic lobe. Similar to previous CT. No signs of biliary ductal dilation. Common bile duct is normal, without filling defect. Variant hepatic ductal anatomy with posterior division right hepatic duct draining into the biliary confluence from the superior liver and inferior right hepatic duct draining just below the biliary confluence but well above the cystic duct confluence. Numerous filling defects in the gallbladder and cystic duct, a large stone in the neck of the gallbladder measuring approximately 1-1.2 cm. Small amount of pericholecystic fluid. Contrast was not administered, this limits assessment of gallbladder wall. Pancreas: Little if any pancreatic edema. No focal peripancreatic fluid. Spleen:  Normal size spleen, no focal lesion. Adrenals/Urinary Tract: Normal appearance of the adrenal glands. Kidneys with smooth renal contours. No signs of hydronephrosis. Stomach/Bowel: Limited assessment of the gastrointestinal tract on MRI. No signs of bowel obstruction or acute bowel process Vascular/Lymphatic: Lack of contrast limits assessment. Vascular structures are grossly patent. Other:  None Musculoskeletal: No suspicious bone lesions identified. IMPRESSION: 1. Distended gallbladder filled with numerous gallstones with a large gallstone in the neck of the gallbladder as described. Small amount of pericholecystic fluid. Findings could indicate early acute calculus cholecystitis. HIDA scan may be useful for further assessment as clinically warranted. 2. Minimal pancreatic edema.  Correlate with pancreatic enzymes. No ductal dilation or peripancreatic fluid. 3. Variant hepatic ductal anatomy as described above. Electronically Signed: By: Donzetta Kohut M.D. On: 05/11/2019 08:08     Scheduled Meds: . [MAR Hold] desogestrel-ethinyl estradiol  1 tablet Oral Daily  . [MAR Hold] fluticasone  2 spray Each Nare Daily  . [MAR Hold] mupirocin ointment   Nasal BID   Continuous Infusions: . lactated ringers Stopped (05/11/19 0650)  . [MAR Hold] piperacillin-tazobactam (ZOSYN)  IV 3.375 g (05/12/19 0626)     LOS: 2 days   Time Spent in minutes   45 minutes  Zakya Halabi D.O. on 05/12/2019 at 8:53 AM  Between 7am to 7pm - Please see pager noted on [email or web address]  After 7pm go to [email or web address]  And look for the night coverage person covering for me after hours  [facility] Office  [phone]

## 2019-05-12 NOTE — Progress Notes (Signed)
Patient had cholecystectomy today.  Intraoperative cholangiogram negative for choledocholithiasis.  [facility] will sign-off; please call back with any questions; thank you for the consultation.

## 2019-05-12 NOTE — Op Note (Signed)
PATIENT:  Jasmine [name]  39 y.o. female  PRE-OPERATIVE DIAGNOSIS:  Pancreatitis  POST-OPERATIVE DIAGNOSIS:  Pancreatitis  PROCEDURE:  Procedure(s): LAPAROSCOPIC CHOLECYSTECTOMY WITH INTRAOPERATIVE CHOLANGIOGRAM Laparoscopic colotomy repair  SURGEON:  Surgeon(s): Tuwanda Vokes, De Blanch, MD  ASSISTANT: Leary Roca, PA  ANESTHESIA:   local and general  Indications for procedure: [name] is a 39 y.o. female with symptoms of Abdominal pain and Nausea and vomiting consistent with gallbladder disease, Confirmed by Ultrasound and CT.  Description of procedure: The patient was brought into the operative suite, placed supine. Anesthesia was administered with endotracheal tube. Patient was strapped in place and foot board was secured. All pressure points were offloaded by foam padding. The patient was prepped and draped in the usual sterile fashion.  A small incision was made to the right of the umbilicus. A 23mm trocar was inserted into the peritoneal cavity with optical entry. Pneumoperitoneum was applied with high flow low pressure. 2 62mm trocars were placed in the RUQ. A 72mm trocar was placed in the subxiphoid space. Marcaine was infused to the subxiphoid space and lateral upper right abdomen in the transversus abdominis plane. Next the patient was placed in reverse trendelenberg. The gallbladder was pink and distended with omentum adhered to the wall. This was taken down with cautery. During this dissection there was a thermal serosal injury to the proximal transverse colon.  The gallbladder was retracted cephalad and lateral. The peritoneum was reflected off the infundibulum working lateral to medial. The cystic duct and cystic artery were identified and further dissection revealed a critical view, due to concern for choledocholithiasis a cholangiogram was performed with ductotomy and cook catheter passed through a separate subcostal stab incision. Normal ductal anatomy was seen without  filling defect. The cystic duct and cystic artery were doubly clipped and ligated.   The gallbladder was removed off the liver bed with cautery. The Gallbladder was placed in a specimen bag. The gallbladder fossa was irrigated and hemostasis was applied with cautery. The gallbladder was removed via the 62mm trocar.  Attention was then turned toward the colon injury. There were 2 small serosal injuries. One additional 5 mm trocar was placed in the left epigastrium. 3-0 silk interrupted lembert sutures were used to imbricate the injury with good result.   The fascial defect was closed with interrupted 0 vicryl suture via laparoscopic trans-fascial suture passer. Pneumoperitoneum was removed, all trocar were removed. All incisions were closed with 4-0 monocryl subcuticular stitch. The patient woke from anesthesia and was brought to PACU in stable condition. All counts were correct  Findings: chronic cholecystitis, normal ductal anatomy  Specimen: gallbladder  Blood loss: 20 ml  Local anesthesia: 30 ml marcaine  Complications: serosal injury to proximal transverse colon repaired with laparoscopic lembert sutures  PLAN OF CARE: Admit to inpatient   PATIENT DISPOSITION:  PACU - hemodynamically stable.  Feliciana Rossetti, M.D. General, Bariatric, & Minimally Invasive Surgery [facility]

## 2019-05-12 NOTE — Anesthesia Procedure Notes (Signed)
Procedure Name: Intubation Date/Time: 05/12/2019 8:56 AM Performed by: Waynard Edwards, CRNA Pre-anesthesia Checklist: Patient identified, Emergency Drugs available, Suction available and Patient being monitored Patient Re-evaluated:Patient Re-evaluated prior to induction Oxygen Delivery Method: Circle system utilized Preoxygenation: Pre-oxygenation with 100% oxygen Induction Type: IV induction Ventilation: Mask ventilation without difficulty Laryngoscope Size: Miller and 2 Grade View: Grade II Tube type: Oral Tube size: 7.0 mm Number of attempts: 1 Airway Equipment and Method: Stylet Placement Confirmation: ETT inserted through vocal cords under direct vision,  positive ETCO2 and breath sounds checked- equal and bilateral Secured at: 22 cm Tube secured with: Tape Dental Injury: Teeth and Oropharynx as per pre-operative assessment

## 2019-05-12 NOTE — Plan of Care (Signed)
  Problem: Education: Goal: Knowledge of General Education information will improve Description Including pain rating scale, medication(s)/side effects and non-pharmacologic comfort measures Outcome: Progressing   

## 2019-05-12 NOTE — Progress Notes (Signed)
Progress Note: General Surgery Service   Chief Complaint/Subjective: Abdominal pain resolved, no nausea or vomiting overnight  Objective: Vital signs in last 24 hours: Temp:  [98.1 F (36.7 C)-99.1 F (37.3 C)] 98.1 F (36.7 C) (03/18 0507) Pulse Rate:  [69-98] 92 (03/18 0507) Resp:  [14-20] 18 (03/18 0507) BP: (99-138)/(60-74) 99/69 (03/18 0507) SpO2:  [99 %-100 %] 99 % (03/18 0507) Weight:  [74.1 kg] 74.1 kg (03/17 1433) Last BM Date: 05/11/19  Intake/Output from previous day: 03/17 0701 - 03/18 0700 In: 875.8 [P.O.:360; I.V.:101.8; IV Piggyback:414] Out: 200 [Urine:200] Intake/Output this shift: No intake/output data recorded.  Gen: NAD  Resp: nonlabored  Card: RRR  Abd: soft, NT, ND  Lab Results: CBC  Recent Labs    05/11/19 0523 05/12/19 0449  WBC 6.4 4.5  HGB 12.6 11.5*  HCT 39.5 35.6*  PLT 151 139*   BMET Recent Labs    05/11/19 0523 05/12/19 0449  NA 137 141  K 3.2* 3.5  CL 105 107  CO2 20* 23  GLUCOSE 78 104*  BUN 9 6  CREATININE 0.84 0.89  CALCIUM 8.4* 8.6*   PT/INR No results for input(s): LABPROT, INR in the last 72 hours. ABG No results for input(s): PHART, HCO3 in the last 72 hours.  Invalid input(s): PCO2, PO2  Anti-infectives: Anti-infectives (From admission, onward)   Start     Dose/Rate Route Frequency Ordered Stop   05/11/19 0600  piperacillin-tazobactam (ZOSYN) IVPB 3.375 g     3.375 g 12.5 mL/hr over 240 Minutes Intravenous Every 8 hours 05/11/19 0549        Medications: Scheduled Meds: . desogestrel-ethinyl estradiol  1 tablet Oral Daily  . fluticasone  2 spray Each Nare Daily  . mupirocin ointment   Nasal BID   Continuous Infusions: . lactated ringers Stopped (05/11/19 0650)  . piperacillin-tazobactam (ZOSYN)  IV 3.375 g (05/12/19 0626)   PRN Meds:.albuterol, ondansetron **OR** ondansetron (ZOFRAN) IV  Assessment/Plan: Gallstone pancreatitis. Her symptoms have resolved her LFTs are downtrending. We will  proceed with laparoscopic cholecystectomy with cholangiogram today. We reviewed the details and indications and all questions were answered. -continue abx -NPO for procedure   LOS: 2 days   Rodman Pickle, MD [phone] [facility]

## 2019-05-12 NOTE — Discharge Instructions (Signed)
[facility] ° °Please arrive at least 30 min before your appointment to complete your check in paperwork.  If you are unable to arrive 30 min prior to your appointment time we may have to cancel or reschedule you. °LAPAROSCOPIC SURGERY: POST OP INSTRUCTIONS °Always review your discharge instruction sheet given to you by the facility where your surgery was performed. °IF YOU HAVE DISABILITY OR FAMILY LEAVE FORMS, YOU MUST BRING THEM TO THE OFFICE FOR PROCESSING.   °DO NOT GIVE THEM TO YOUR DOCTOR. ° °PAIN CONTROL ° °1. First take acetaminophen (Tylenol) AND/or ibuprofen (Advil) to control your pain after surgery.  Follow directions on package.  Taking acetaminophen (Tylenol) and/or ibuprofen (Advil) regularly after surgery will help to control your pain and lower the amount of prescription pain medication you may need.  You should not take more than 4,000 mg (4 grams) of acetaminophen (Tylenol) in 24 hours.  You should not take ibuprofen (Advil), aleve, motrin, naprosyn or other NSAIDS if you have a history of stomach ulcers or chronic kidney disease.  °2. A prescription for pain medication may be given to you upon discharge.  Take your pain medication as prescribed, if you still have uncontrolled pain after taking acetaminophen (Tylenol) or ibuprofen (Advil). °3. Use ice packs to help control pain. °4. If you need a refill on your pain medication, please contact your pharmacy.  They will contact our office to request authorization. Prescriptions will not be filled after 5pm or on week-ends. ° °HOME MEDICATIONS °5. Take your usually prescribed medications unless otherwise directed. ° °DIET °6. You should follow a light diet the first few days after arrival home.  Be sure to include lots of fluids daily. Avoid fatty, fried foods.  ° °CONSTIPATION °7. It is common to experience some constipation after surgery and if you are taking pain medication.  Increasing fluid intake and taking a stool  softener (such as Colace) will usually help or prevent this problem from occurring.  A mild laxative (Milk of Magnesia or Miralax) should be taken according to package instructions if there are no bowel movements after 48 hours. ° °WOUND/INCISION CARE °8. Most patients will experience some swelling and bruising in the area of the incisions.  Ice packs will help.  Swelling and bruising can take several days to resolve.  °9. Unless discharge instructions indicate otherwise, follow guidelines below  °a. STERI-STRIPS - you may remove your outer bandages 48 hours after surgery, and you may shower at that time.  You have steri-strips (small skin tapes) in place directly over the incision.  These strips should be left on the skin for 7-10 days.   °b. DERMABOND/SKIN GLUE - you may shower in 24 hours.  The glue will flake off over the next 2-3 weeks. °10. Any sutures or staples will be removed at the office during your follow-up visit. ° °ACTIVITIES °11. You may resume regular (light) daily activities beginning the next day--such as daily self-care, walking, climbing stairs--gradually increasing activities as tolerated.  You may have sexual intercourse when it is comfortable.  Refrain from any heavy lifting or straining until approved by your doctor. °a. You may drive when you are no longer taking prescription pain medication, you can comfortably wear a seatbelt, and you can safely maneuver your car and apply brakes. ° °FOLLOW-UP °12. You should see your doctor in the office for a follow-up appointment approximately 2-3 weeks after your surgery.  You should have been given your post-op/follow-up appointment when   your surgery was scheduled.  If you did not receive a post-op/follow-up appointment, make sure that you call for this appointment within a day or two after you arrive home to insure a convenient appointment time. ° °OTHER INSTRUCTIONS ° °WHEN TO CALL YOUR DOCTOR: °1. Fever over 101.0 °2. Inability to  urinate °3. Continued bleeding from incision. °4. Increased pain, redness, or drainage from the incision. °5. Increasing abdominal pain ° °The clinic staff is available to answer your questions during regular business hours.  Please don’t hesitate to call and ask to speak to one of the nurses for clinical concerns.  If you have a medical emergency, go to the nearest emergency room or call 911.  A surgeon from [facility] is always on call at the hospital. °[address], [address], [place], [place]  [address] ? [address] [address], [place], [place]   [address] °[phone] ? [phone] ? FAX [phone] ° ° ° °

## 2019-05-12 NOTE — Transfer of Care (Signed)
Immediate Anesthesia Transfer of Care Note  Patient: Jasmine [name]  Procedure(s) Performed: LAPAROSCOPIC CHOLECYSTECTOMY WITH INTRAOPERATIVE CHOLANGIOGRAM (N/A Abdomen)  Patient Location: PACU  Anesthesia Type:General  Level of Consciousness: drowsy and patient cooperative  Airway & Oxygen Therapy: Patient Spontanous Breathing and Patient connected to nasal cannula oxygen  Post-op Assessment: Report given to RN, Post -op Vital signs reviewed and stable and Patient moving all extremities X 4  Post vital signs: Reviewed and stable  Last Vitals:  Vitals Value Taken Time  BP 121/76 05/12/19 1050  Temp    Pulse 94 05/12/19 1052  Resp 14 05/12/19 1052  SpO2 100 % 05/12/19 1052  Vitals shown include unvalidated device data.  Last Pain:  Vitals:   05/12/19 0833  TempSrc:   PainSc: 0-No pain      Patients Stated Pain Goal: 4 (05/12/19 9842)  Complications: No apparent anesthesia complications

## 2019-05-13 DIAGNOSIS — K5900 Constipation, unspecified: Secondary | ICD-10-CM

## 2019-05-13 LAB — COMPREHENSIVE METABOLIC PANEL
ALT: 78 U/L — ABNORMAL HIGH (ref 0–44)
AST: 26 U/L (ref 15–41)
Albumin: 3.1 g/dL — ABNORMAL LOW (ref 3.5–5.0)
Alkaline Phosphatase: 66 U/L (ref 38–126)
Anion gap: 12 (ref 5–15)
BUN: <5 mg/dL — ABNORMAL LOW (ref 6–20)
CO2: 23 mmol/L (ref 22–32)
Calcium: 8.4 mg/dL — ABNORMAL LOW (ref 8.9–10.3)
Chloride: 104 mmol/L (ref 98–111)
Creatinine, Ser: 0.76 mg/dL (ref 0.44–1.00)
GFR calc Af Amer: >60 mL/min (ref >60)
GFR calc non Af Amer: >60 mL/min (ref >60)
Glucose, Bld: 95 mg/dL (ref 70–99)
Potassium: 3.6 mmol/L (ref 3.5–5.1)
Sodium: 139 mmol/L (ref 135–145)
Total Bilirubin: 0.6 mg/dL (ref 0.3–1.2)
Total Protein: 6 g/dL — ABNORMAL LOW (ref 6.5–8.1)

## 2019-05-13 LAB — CBC
HCT: 36.3 % (ref 36.0–46.0)
Hemoglobin: 11.6 g/dL — ABNORMAL LOW (ref 12.0–15.0)
MCH: 29.3 pg (ref 26.0–34.0)
MCHC: 32 g/dL (ref 30.0–36.0)
MCV: 91.7 fL (ref 80.0–100.0)
Platelets: 153 10*3/uL (ref 150–400)
RBC: 3.96 MIL/uL (ref 3.87–5.11)
RDW: 13.2 % (ref 11.5–15.5)
WBC: 9.9 10*3/uL (ref 4.0–10.5)
nRBC: 0 % (ref 0.0–0.2)

## 2019-05-13 LAB — SURGICAL PATHOLOGY

## 2019-05-13 MED ORDER — BISACODYL 5 MG PO TBEC: 5.0000 mg | DELAYED_RELEASE_TABLET | Freq: Every day | ORAL | Status: DC | PRN

## 2019-05-13 MED ORDER — POLYETHYLENE GLYCOL 3350 17 G PO PACK
17.0000 g | PACK | Freq: Once | ORAL | Status: AC
  Administered 2019-05-13: 17 g via ORAL
  Filled 2019-05-13: qty 1

## 2019-05-13 MED ORDER — IBUPROFEN 800 MG PO TABS: 800.0000 mg | ORAL_TABLET | Freq: Three times a day (TID) | ORAL | 0 refills | Status: DC | PRN

## 2019-05-13 MED ORDER — OXYCODONE HCL 5 MG PO TABS: 5.0000 mg | ORAL_TABLET | Freq: Four times a day (QID) | ORAL | 0 refills | Status: DC | PRN

## 2019-05-13 NOTE — Plan of Care (Signed)
  Problem: Education: Goal: Knowledge of General Education information will improve Description Including pain rating scale, medication(s)/side effects and non-pharmacologic comfort measures Outcome: Progressing   

## 2019-05-13 NOTE — Progress Notes (Signed)
PROGRESS NOTE    Jasmine [name]  GYJ:[id number] DOB: December 31, 1980 DOA: 05/10/2019 PCP: Renaye Rakers, MD   Brief Narrative:  HPI on 05/11/2019 by Dr. Midge Minium Jasmine [name] is a 39 y.o. female with no significant past medical history presents to the ER because of abdominal pain.  Patient has been having abdominal pain for last 3 to 4 days mostly in the epigastric area with nausea vomiting denies any diarrhea.  Denies any blood in the vomitus.  Denies any fever chills or chest pain.  Denies drinking alcohol.  Had gone to urgent care was prescribed PPI despite taking which patient was denying pain.  Pain is stabbing in nature nonradiating.  Patient used to be on Ozempic weight loss until 2 months ago and has not taken anything for last 2 months.  Interim history Admitted this morning with abdominal pain.  Found to have elevated lipase of over 3000 as well as LFTs.  General surgery gastroenterology consulted and appreciated.  CT abdomen pelvis showed acute pancreatitis with periportal edema.  MRCP showed gallstones.  Continue IV fluids. S/p surgery. Assessment & Plan   Acute pancreatitis secondary to gallstones -Possibly secondary to gallbladder disease.  Patient denies any chronic alcohol use, only occasional/social drinking. -CT abdomen/pelvis: Acute mild pancreatitis without portal venous splenic vein thrombosis or pancreatic necrosis.  Mild periportal edema. -MRCP: Distended gallbladder filled with numerous gallstones with a large gallstone in the neck of the gallbladder as described.  Small amount of pericholecystic fluid.  Early acute calculus cholecystitis. -Lipase of 3181 on admission, down to 57 today -Patient with elevated LFTs- however trending downward -General surgery consulted and appreciated, s/p laparoscopic cholecystectomy with intraoperative cholangiogram, laparoscopic colectomy repair -Gastroenterology consulted and appreciated- signed off as IOC negative -placed patient on  soft diet, tolerating well  Elevated LFTs -As above, trending downward -Hepatitis panel unremarkable   Hypokalemia -resolved with replacement -continue to monitor   Hepatic lesion -CT abdomen pelvis showed 18 mm hypoenhancing hepatic lesion with peri peripheral nodular enhancement and vascularity, probably benign hemangioma.  Consider ultrasound in 3 to 6 months.  Mild constipation -continue bowel regimen -placed on miralax, colace  DVT Prophylaxis  SCDs  Code Status: Full  Family Communication: None at bedside.  Disposition Plan: Admitted from home for abdominal pain.  Found to have acute pancreatitis likely secondary to gallbladder disease.  S/p surgery today. Will likely discharge home on 3/20.  Consultants Gastroenterology General surgery  Procedures  MRCP laparoscopic cholecystectomy with intraoperative cholangiogram, laparoscopic colectomy repair  Antibiotics   Anti-infectives (From admission, onward)   Start     Dose/Rate Route Frequency Ordered Stop   05/11/19 0600  piperacillin-tazobactam (ZOSYN) IVPB 3.375 g  Status:  Discontinued     3.375 g 12.5 mL/hr over 240 Minutes Intravenous Every 8 hours 05/11/19 0549 05/12/19 1218      Subjective:   Jasmine [name] seen and examined today.  Has some abdominal pain mildly, I will place order.  Patient feels that she is constipated and needs to go to the bathroom however afraid to push down.  Denies current chest pain or shortness of breath, nausea or vomiting, dizziness or headache. Objective:   Vitals:   05/12/19 1700 05/12/19 2128 05/13/19 0516 05/13/19 0916  BP: 138/69 107/71 (!) 100/59 115/64  Pulse: 66 65 80 73  Resp: 18 18 18 16   Temp: 98.2 F (36.8 C) 98.2 F (36.8 C) 98 F (36.7 C) 98.4 F (36.9 C)  TempSrc: Oral Oral Oral Oral  SpO2:  100% 99% 100%  Weight:      Height:        Intake/Output Summary (Last 24 hours) at 05/13/2019 1616 Last data filed at 05/13/2019 0829 Gross per 24 hour  Intake  1832.01 ml  Output 2 ml  Net 1830.01 ml   Filed Weights   05/11/19 1433 05/12/19 0833  Weight: 74.1 kg 74.1 kg   Exam  General: Well developed, well nourished, NAD, appears stated age  33: NCAT, mucous membranes moist.   Cardiovascular: S1 S2 auscultated, RRR  Respiratory: Clear to auscultation bilaterally   Abdomen: Soft, nontender, nondistended, + bowel sounds, incisions clean and dry  Extremities: warm dry without cyanosis clubbing or edema  Neuro: AAOx3, nonfocal  Psych: Appropriate mood and affect, pleasant  Data Reviewed: I have personally reviewed following labs and imaging studies  CBC: Recent Labs  Lab 05/10/19 1613 05/11/19 0523 05/12/19 0449 05/13/19 0338  WBC 6.9 6.4 4.5 9.9  NEUTROABS  --  4.4  --   --   HGB 13.5 12.6 11.5* 11.6*  HCT 43.1 39.5 35.6* 36.3  MCV 93.3 91.6 90.8 91.7  PLT 160 151 139* 263   Basic Metabolic Panel: Recent Labs  Lab 05/10/19 1613 05/11/19 0523 05/12/19 0449 05/13/19 0338  NA 138 137 141 139  K 3.2* 3.2* 3.5 3.6  CL 105 105 107 104  CO2 24 20* 23 23  GLUCOSE 96 78 104* 95  BUN 11 9 6  <5*  CREATININE 0.90 0.84 0.89 0.76  CALCIUM 8.8* 8.4* 8.6* 8.4*   GFR: Estimated Creatinine Clearance: 98.1 mL/min (by C-G formula based on SCr of 0.76 mg/dL). Liver Function Tests: Recent Labs  Lab 05/10/19 1613 05/11/19 0523 05/12/19 0449 05/13/19 0338  AST 216* 108* 37 26  ALT 180* 168* 101* 78*  ALKPHOS 108 91 74 66  BILITOT 1.4* 0.7 0.5 0.6  PROT 6.9 6.4* 5.8* 6.0*  ALBUMIN 3.7 3.4* 3.1* 3.1*   Recent Labs  Lab 05/10/19 1613 05/12/19 0449  LIPASE 3,181* 57*   No results for input(s): AMMONIA in the last 168 hours. Coagulation Profile: No results for input(s): INR, PROTIME in the last 168 hours. Cardiac Enzymes: No results for input(s): CKTOTAL, CKMB, CKMBINDEX, TROPONINI in the last 168 hours. BNP (last 3 results) No results for input(s): PROBNP in the last 8760 hours. HbA1C: No results for input(s):  HGBA1C in the last 72 hours. CBG: No results for input(s): GLUCAP in the last 168 hours. Lipid Profile: Recent Labs    05/11/19 0046  TRIG 42   Thyroid Function Tests: No results for input(s): TSH, T4TOTAL, FREET4, T3FREE, THYROIDAB in the last 72 hours. Anemia Panel: No results for input(s): VITAMINB12, FOLATE, FERRITIN, TIBC, IRON, RETICCTPCT in the last 72 hours. Urine analysis:    Component Value Date/Time   COLORURINE AMBER (A) 05/10/2019 2004   APPEARANCEUR CLEAR 05/10/2019 2004   LABSPEC 1.029 05/10/2019 2004   PHURINE 5.0 05/10/2019 2004   GLUCOSEU NEGATIVE 05/10/2019 2004   HGBUR NEGATIVE 05/10/2019 2004   BILIRUBINUR NEGATIVE 05/10/2019 2004   BILIRUBINUR n 11/20/2011 1558   KETONESUR 5 (A) 05/10/2019 2004   PROTEINUR 30 (A) 05/10/2019 2004   UROBILINOGEN 0.2 11/20/2011 1558   UROBILINOGEN 0.2 09/14/2006 1910   NITRITE NEGATIVE 05/10/2019 2004   LEUKOCYTESUR NEGATIVE 05/10/2019 2004   Sepsis Labs: @LABRCNTIP (procalcitonin:4,lacticidven:4)  ) Recent Results (from the past 240 hour(s))  SARS CORONAVIRUS 2 (TAT 6-24 HRS) Nasopharyngeal Nasopharyngeal Swab     Status: None   Collection Time: 05/10/19  11:37 PM   Specimen: Nasopharyngeal Swab  Result Value Ref Range Status   SARS Coronavirus 2 NEGATIVE NEGATIVE Final    Comment: (NOTE) SARS-CoV-2 target nucleic acids are NOT DETECTED. The SARS-CoV-2 RNA is generally detectable in upper and lower respiratory specimens during the acute phase of infection. Negative results do not preclude SARS-CoV-2 infection, do not rule out co-infections with other pathogens, and should not be used as the sole basis for treatment or other patient management decisions. Negative results must be combined with clinical observations, patient history, and epidemiological information. The expected result is Negative. Fact Sheet for Patients: HairSlick.no Fact Sheet for Healthcare  Providers: quierodirigir.com This test is not yet approved or cleared by the [place] FDA and  has been authorized for detection and/or diagnosis of SARS-CoV-2 by FDA under an Emergency Use Authorization (EUA). This EUA will remain  in effect (meaning this test can be used) for the duration of the COVID-19 declaration under Section 56 4(b)(1) of the Act, 21 U.S.C. section 360bbb-3(b)(1), unless the authorization is terminated or revoked sooner. Performed at [facility], [address] [place], [place] [address]   Surgical pcr screen     Status: Abnormal   Collection Time: 05/11/19  3:46 PM   Specimen: Nasal Mucosa; Nasal Swab  Result Value Ref Range Status   MRSA, PCR NEGATIVE NEGATIVE Final   Staphylococcus aureus POSITIVE (A) NEGATIVE Final    Comment: (NOTE) The Xpert SA Assay (FDA approved for NASAL specimens in patients 36 years of age and older), is one component of a comprehensive surveillance program. It is not intended to diagnose infection nor to guide or monitor treatment. Performed at [facility], [address] [place], [place] [address]       Radiology Studies: DG Cholangiogram Operative  Result Date: 05/12/2019 CLINICAL DATA:  Intraoperative cholangiogram during laparoscopic cholecystectomy. EXAM: INTRAOPERATIVE CHOLANGIOGRAM FLUOROSCOPY TIME:  10 seconds COMPARISON:  MRCP-05/11/2019; CT abdomen and pelvis-05/10/2019 FINDINGS: Intraoperative cholangiographic images of the right upper abdominal quadrant during laparoscopic cholecystectomy are provided for review. Surgical clips overlie the expected location of the gallbladder fossa. Contrast injection demonstrates selective cannulation of the central aspect of the cystic duct. There is passage of contrast through the central aspect of the cystic duct with filling of a non dilated common bile duct. There is passage of contrast though the CBD and into the descending portion of  the duodenum. There is minimal reflux of injected contrast into the common hepatic duct and central aspect of the non dilated intrahepatic biliary system. There are no discrete filling defects within the opacified portions of the biliary system to suggest the presence of choledocholithiasis. IMPRESSION: No evidence of choledocholithiasis. Electronically Signed   By: Simonne Come M.D.   On: 05/12/2019 11:41     Scheduled Meds: . celecoxib  200 mg Oral BID  . desogestrel-ethinyl estradiol  1 tablet Oral Daily  . docusate sodium  100 mg Oral BID  . enoxaparin (LOVENOX) injection  40 mg Subcutaneous Q24H  . fluticasone  2 spray Each Nare Daily  . mupirocin ointment   Nasal BID   Continuous Infusions: . lactated ringers 100 mL/hr at 05/13/19 0829     LOS: 3 days   Time Spent in minutes   30 minutes  Zakyra Kukuk D.O. on 05/13/2019 at 4:16 PM  Between 7am to 7pm - Please see pager noted on [email or web address]  After 7pm go to [email or web address]  And look for the night coverage person covering  for me after hours  [facility] Office  [phone]

## 2019-05-13 NOTE — Progress Notes (Signed)
Progress Note: General Surgery Service   Chief Complaint/Subjective: Tolerating diet, some abdominal soreness  Objective: Vital signs in last 24 hours: Temp:  [97.7 F (36.5 C)-98.6 F (37 C)] 98 F (36.7 C) (03/19 0516) Pulse Rate:  [64-80] 80 (03/19 0516) Resp:  [11-18] 18 (03/19 0516) BP: (100-138)/(59-76) 100/59 (03/19 0516) SpO2:  [93 %-100 %] 99 % (03/19 0516) Weight:  [74.1 kg] 74.1 kg (03/18 0833) Last BM Date: 05/11/19  Intake/Output from previous day: 03/18 0701 - 03/19 0700 In: 3249.6 [P.O.:560; I.V.:2566.7; IV Piggyback:122.8] Out: 27 [Urine:2; Blood:25] Intake/Output this shift: Total I/O In: 240 [P.O.:240] Out: -   Gen: NAD  Resp: nonlabored  Card: RRR  Abd: soft, incisions c/d/i, tenderness in RUQ and at subxiphoid incision, no guarding  Lab Results: CBC  Recent Labs    05/12/19 0449 05/13/19 0338  WBC 4.5 9.9  HGB 11.5* 11.6*  HCT 35.6* 36.3  PLT 139* 153   BMET Recent Labs    05/12/19 0449 05/13/19 0338  NA 141 139  K 3.5 3.6  CL 107 104  CO2 23 23  GLUCOSE 104* 95  BUN 6 <5*  CREATININE 0.89 0.76  CALCIUM 8.6* 8.4*   PT/INR No results for input(s): LABPROT, INR in the last 72 hours. ABG No results for input(s): PHART, HCO3 in the last 72 hours.  Invalid input(s): PCO2, PO2  Anti-infectives: Anti-infectives (From admission, onward)   Start     Dose/Rate Route Frequency Ordered Stop   05/11/19 0600  piperacillin-tazobactam (ZOSYN) IVPB 3.375 g  Status:  Discontinued     3.375 g 12.5 mL/hr over 240 Minutes Intravenous Every 8 hours 05/11/19 0549 05/12/19 1218      Medications: Scheduled Meds: . celecoxib  200 mg Oral BID  . desogestrel-ethinyl estradiol  1 tablet Oral Daily  . docusate sodium  100 mg Oral BID  . enoxaparin (LOVENOX) injection  40 mg Subcutaneous Q24H  . fluticasone  2 spray Each Nare Daily  . mupirocin ointment   Nasal BID   Continuous Infusions: . lactated ringers 100 mL/hr at 05/13/19 0829   PRN  Meds:.acetaminophen **OR** acetaminophen, albuterol, diphenhydrAMINE **OR** diphenhydrAMINE, methocarbamol, morphine injection, ondansetron **OR** ondansetron (ZOFRAN) IV, oxyCODONE, polyethylene glycol, simethicone  Assessment/Plan: s/p Procedure(s): LAPAROSCOPIC CHOLECYSTECTOMY WITH INTRAOPERATIVE CHOLANGIOGRAM 05/12/2019 Doing well, tolerating diet -Miralax and bowel regimen -ambulate -will reassess this afternoon for potential discharge -recommended rescheduling 2nd vaccine dose for > 5 days after surgery to avoid convoluting clinical symptoms and causes   LOS: 3 days   Rodman Pickle, MD [phone] [facility]

## 2019-05-13 NOTE — Discharge Summary (Signed)
Physician Discharge Summary  Pa Tennant CHY:[id number] DOB: [birth year] DOA: 05/10/2019  PCP: Lucianne Lei, MD  Admit date: 05/10/2019 Discharge date: 05/14/2019  Time spent: 45 minutes  Recommendations for Outpatient Follow-up:  Patient will be discharged to home.  Patient will need to follow up with primary care provider within one week of discharge.  Follow up with general surgery. Patient should continue medications as prescribed.  Patient should follow a soft diet.   Discharge Diagnoses:  Acute pancreatitis secondary to gallstones Elevated LFTs Hypokalemia Hepatic lesion  Discharge Condition: Stable  Diet recommendation: soft diet  Filed Weights   05/11/19 1433 05/12/19 0833  Weight: 74.1 kg 74.1 kg    History of present illness:  on 05/11/2019 by Dr. Gean Birchwood Jaslin Coleis a 39 y.o.femalewithno significant past medical history presents to the ER because of abdominal pain. Patient has been having abdominal pain for last 3 to 4 days mostly in the epigastric area with nausea vomiting denies any diarrhea. Denies any blood in the vomitus. Denies any fever chills or chest pain. Denies drinking alcohol. Had gone to urgent care was prescribed PPI despite taking which patient was denying pain. Pain is stabbing in nature nonradiating.  Patient used to be on Ozempic weight loss until 2 months ago and has not taken anything for last 2 months.  Hospital Course:  Acute pancreatitis secondary to gallstones -Possibly secondary to gallbladder disease.  Patient denies any chronic alcohol use, only occasional/social drinking. -CT abdomen/pelvis: Acute mild pancreatitis without portal venous splenic vein thrombosis or pancreatic necrosis.  Mild periportal edema. -MRCP: Distended gallbladder filled with numerous gallstones with a large gallstone in the neck of the gallbladder as described.  Small amount of pericholecystic fluid.  Early acute calculus cholecystitis. -Lipase  of 3181 on admission, down to 57  -Patient with elevated LFTs- however improved and trended downward -General surgery consulted and appreciated, s/p laparoscopic cholecystectomy with intraoperative cholangiogram, laparoscopic colectomy repair -Gastroenterology consulted and appreciated -Placed on soft diet and tolerated well -has had several bowel movements  Elevated LFTs -As above, trending downward -Hepatitis panel unremarkable   Hypokalemia -resolved with replacement  Hepatic lesion -CT abdomen pelvis showed 18 mm hypoenhancing hepatic lesion with peri peripheral nodular enhancement and vascularity, probably benign hemangioma.  Consider ultrasound in 3 to 6 months.  Mild constipation -continue bowel regimen -placed on miralax, colace  Consultants Gastroenterology General surgery  Procedures  MRCP Laparoscopic cholecystectomy with intraoperative cholangiogram, laparoscopic colectomy repair  Discharge Exam: Vitals:   05/13/19 2116 05/14/19 0534  BP: 110/63 (!) 95/58  Pulse: 83 70  Resp: 18 18  Temp: 98.7 F (37.1 C)   SpO2: 100% 96%     General: Well developed, well nourished, NAD, appears stated age  32: NCAT, mucous membranes moist.  Cardiovascular: S1 S2 auscultated, RRR  Respiratory: Clear to auscultation bilaterally   Abdomen: Soft, nontender, nondistended, + bowel sounds, incisions clean and dry  Extremities: warm dry without cyanosis clubbing or edema  Neuro: AAOx3, nonfocal  Psych: Appropriate mood and affect, pleasant   Discharge Instructions Discharge Instructions    Discharge instructions   Complete by: As directed    Patient will be discharged to home.  Patient will need to follow up with primary care provider within one week of discharge.  Follow up with general surgery. Patient should continue medications as prescribed.  Patient should follow a soft diet.     Allergies as of 05/14/2019   No Known Allergies     Medication List  TAKE these medications   albuterol 108 (90 Base) MCG/ACT inhaler Commonly known as: ProAir HFA Inhale 2 puffs into the lungs every 4 (four) hours as needed for wheezing or shortness of breath.   docusate sodium 100 MG capsule Commonly known as: COLACE Take 1 capsule (100 mg total) by mouth 2 (two) times daily as needed for mild constipation.   fluticasone 50 MCG/ACT nasal spray Commonly known as: FLONASE SPRAY 2 SPRAYS INTO EACH NOSTRIL EVERY DAY What changed: See the new instructions.   ibuprofen 800 MG tablet Commonly known as: ADVIL Take 1 tablet (800 mg total) by mouth every 8 (eight) hours as needed.   Kariva 0.15-0.02/0.01 MG (21/5) tablet Generic drug: desogestrel-ethinyl estradiol Take 1 tablet by mouth daily.   omeprazole 20 MG capsule Commonly known as: PRILOSEC Take 1 capsule (20 mg total) by mouth daily.   oxyCODONE 5 MG immediate release tablet Commonly known as: Oxy IR/ROXICODONE Take 1 tablet (5 mg total) by mouth every 6 (six) hours as needed for up to 20 doses for severe pain. 1-2 Tabs PO q6h PRN pain   Ozempic (1 MG/DOSE) 2 MG/1.5ML Sopn Generic drug: Semaglutide (1 MG/DOSE) Inject 0.75 mLs into the skin once a week.   polyethylene glycol 17 g packet Commonly known as: MIRALAX / GLYCOLAX Take 17 g by mouth daily as needed for mild constipation.      No Known Allergies Follow-up Information    [facility], [place]. Go on 05/31/2019.   Specialty: General Surgery Why: Your appointment is 04/06 at 11:30 am Please arrive 30 minutes prior to your appointment to check in and fill out paperwork. Bring photo ID and insurance information. Contact information: [address] [place] [address] [phone]       Renaye Rakers, MD. Schedule an appointment as soon as possible for a visit in 1 week(s).   Specialty: Family Medicine Why: Hospital follow up Contact information: [address] [place]  [address] [phone]            The results of significant diagnostics from this hospitalization (including imaging, microbiology, ancillary and laboratory) are listed below for reference.    Significant Diagnostic Studies: DG Cholangiogram Operative  Result Date: 05/12/2019 CLINICAL DATA:  Intraoperative cholangiogram during laparoscopic cholecystectomy. EXAM: INTRAOPERATIVE CHOLANGIOGRAM FLUOROSCOPY TIME:  10 seconds COMPARISON:  MRCP-05/11/2019; CT abdomen and pelvis-05/10/2019 FINDINGS: Intraoperative cholangiographic images of the right upper abdominal quadrant during laparoscopic cholecystectomy are provided for review. Surgical clips overlie the expected location of the gallbladder fossa. Contrast injection demonstrates selective cannulation of the central aspect of the cystic duct. There is passage of contrast through the central aspect of the cystic duct with filling of a non dilated common bile duct. There is passage of contrast though the CBD and into the descending portion of the duodenum. There is minimal reflux of injected contrast into the common hepatic duct and central aspect of the non dilated intrahepatic biliary system. There are no discrete filling defects within the opacified portions of the biliary system to suggest the presence of choledocholithiasis. IMPRESSION: No evidence of choledocholithiasis. Electronically Signed   By: Simonne Come M.D.   On: 05/12/2019 11:41   CT Abdomen Pelvis W Contrast  Result Date: 05/10/2019 CLINICAL DATA:  Acute abdominal pain. EXAM: CT ABDOMEN AND PELVIS WITH CONTRAST TECHNIQUE: Multidetector CT imaging of the abdomen and pelvis was performed using the standard protocol following bolus administration of intravenous contrast. Automatic exposure control utilized. CONTRAST:  OMNIPAQUE IOHEXOL 300 MG/ML  SOLN COMPARISON:  None. FINDINGS: Lower chest: Normal. Hepatobiliary: None mild periportal edema. Partial characterization of an 18 mm central  hypoenhancing lesion with some nodular peripheral enhancement and vascularity which would favor a benign hemangioma etiology. Patent hepatic and portal veins. Normal hepatic size and contours. Normal gallbladder and common bile duct. Pancreas: Mild heterogeneity of the pancreatic parenchyma with effacement of the normal lobular contours concerning for a subtle acute pancreatitis. Backspace no sequela of chronic pancreatitis. Paucity of mesenteric fat, including in the peripancreatic region. No obvious free fluid, adenopathy or pseudocyst. Patent splenic vein. No evidence for pancreatic necrosis. Spleen: Normal. Adrenals/Urinary Tract: No apparent abnormality. Stomach/Bowel: Normal appendix, axial series 3, image 58. Moderate stool burden without bowel obstruction or apparent bowel wall thickening. Decompressed stomach without an apparent abnormality. Vascular/Lymphatic: No adenopathy. 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 Normal caliber of the abdominal aorta without calcified atherosclerosis. Reproductive: Normally sized uterus with possible small uterine fibroid in. A left ovarian 13 mm benign functional cyst. Otherwise normal appearance of both ovaries. Other: No free intraperitoneal fluid or air. Musculoskeletal: Mild degenerative change and acquired disc disease at the lumbosacral junction with grade 1 retrolisthesis of L5 on S1 that is likely degenerative. Mild left sacroiliitis. IMPRESSION: Imaging findings that would support a clinical diagnosis of an acute mild pancreatitis without portal vein or splenic vein thrombosis or pancreatic necrosis. No pseudocyst or sequela of chronic pancreatitis. Mild periportal edema with normal gallbladder and common bile duct. An 18 mm hypoenhancing hepatic lesion with peripheral nodular enhancement in vascularity, probably a benign hemangioma. No further MR characterization is indicated. Given the elevated hepatic  enzymes and abdominal pain, surveillance ultrasonography could be considered in 3-6 months. Aortic Atherosclerosis (ICD10-I70.0). Electronically Signed   By: Laurence Ferrari   On: 05/10/2019 23:09   MR ABDOMEN MRCP WO CONTRAST  Addendum Date: 05/11/2019   ADDENDUM REPORT: 05/11/2019 08:12 ADDENDUM: Presumed hepatic hemangioma as described. Electronically Signed   By: Donzetta Kohut M.D.   On: 05/11/2019 08:12   Result Date: 05/11/2019 CLINICAL DATA:  Abdominal pain, suspected pancreatitis. EXAM: MRI ABDOMEN WITHOUT CONTRAST  (INCLUDING MRCP) TECHNIQUE: Multiplanar multisequence MR imaging of the abdomen was performed. Heavily T2-weighted images of the biliary and pancreatic ducts were obtained, and three-dimensional MRCP images were rendered by post processing. COMPARISON:  CT evaluation of 05/10/2019 FINDINGS: Lower chest: Incidental imaging of the lung bases is unremarkable, limited assessment on MRI. Hepatobiliary: Presumed hemangioma in the right hepatic lobe. Similar to previous CT. No signs of biliary ductal dilation. Common bile duct is normal, without filling defect. Variant hepatic ductal anatomy with posterior division right hepatic duct draining into the biliary confluence from the superior liver and inferior right hepatic duct draining just below the biliary confluence but well above the cystic duct confluence. Numerous filling defects in the gallbladder and cystic duct, a large stone in the neck of the gallbladder measuring approximately 1-1.2 cm. Small amount of pericholecystic fluid. Contrast was not administered, this limits assessment of gallbladder wall. Pancreas: Little if any pancreatic edema. No focal peripancreatic fluid. Spleen:  Normal size spleen, no focal lesion. Adrenals/Urinary Tract: Normal appearance of the adrenal glands. Kidneys with smooth renal contours. No signs of hydronephrosis. Stomach/Bowel: Limited assessment of the gastrointestinal tract on MRI. No signs of bowel  obstruction or acute bowel process Vascular/Lymphatic: Lack of contrast limits assessment. Vascular structures are grossly patent. Other:  None Musculoskeletal: No  suspicious bone lesions identified. IMPRESSION: 1. Distended gallbladder filled with numerous gallstones with a large gallstone in the neck of the gallbladder as described. Small amount of pericholecystic fluid. Findings could indicate early acute calculus cholecystitis. HIDA scan may be useful for further assessment as clinically warranted. 2. Minimal pancreatic edema. Correlate with pancreatic enzymes. No ductal dilation or peripancreatic fluid. 3. Variant hepatic ductal anatomy as described above. Electronically Signed: By: Donzetta Kohut M.D. On: 05/11/2019 08:08    Microbiology: Recent Results (from the past 240 hour(s))  SARS CORONAVIRUS 2 (TAT 6-24 HRS) Nasopharyngeal Nasopharyngeal Swab     Status: None   Collection Time: 05/10/19 11:37 PM   Specimen: Nasopharyngeal Swab  Result Value Ref Range Status   SARS Coronavirus 2 NEGATIVE NEGATIVE Final    Comment: (NOTE) SARS-CoV-2 target nucleic acids are NOT DETECTED. The SARS-CoV-2 RNA is generally detectable in upper and lower respiratory specimens during the acute phase of infection. Negative results do not preclude SARS-CoV-2 infection, do not rule out co-infections with other pathogens, and should not be used as the sole basis for treatment or other patient management decisions. Negative results must be combined with clinical observations, patient history, and epidemiological information. The expected result is Negative. Fact Sheet for Patients: HairSlick.no Fact Sheet for Healthcare Providers: quierodirigir.com This test is not yet approved or cleared by the [place] FDA and  has been authorized for detection and/or diagnosis of SARS-CoV-2 by FDA under an Emergency Use Authorization (EUA). This EUA will remain    in effect (meaning this test can be used) for the duration of the COVID-19 declaration under Section 56 4(b)(1) of the Act, 21 U.S.C. section 360bbb-3(b)(1), unless the authorization is terminated or revoked sooner. Performed at [facility], [address], [place], [place] [address]   Surgical pcr screen     Status: Abnormal   Collection Time: 05/11/19  3:46 PM   Specimen: Nasal Mucosa; Nasal Swab  Result Value Ref Range Status   MRSA, PCR NEGATIVE NEGATIVE Final   Staphylococcus aureus POSITIVE (A) NEGATIVE Final    Comment: (NOTE) The Xpert SA Assay (FDA approved for NASAL specimens in patients 23 years of age and older), is one component of a comprehensive surveillance program. It is not intended to diagnose infection nor to guide or monitor treatment. Performed at [facility], [address], [place], [place] [address]      Labs: Basic Metabolic Panel: Recent Labs  Lab 05/10/19 1613 05/11/19 0523 05/12/19 0449 05/13/19 0338  NA 138 137 141 139  K 3.2* 3.2* 3.5 3.6  CL 105 105 107 104  CO2 24 20* 23 23  GLUCOSE 96 78 104* 95  BUN 11 9 6  <5*  CREATININE 0.90 0.84 0.89 0.76  CALCIUM 8.8* 8.4* 8.6* 8.4*   Liver Function Tests: Recent Labs  Lab 05/10/19 1613 05/11/19 0523 05/12/19 0449 05/13/19 0338  AST 216* 108* 37 26  ALT 180* 168* 101* 78*  ALKPHOS 108 91 74 66  BILITOT 1.4* 0.7 0.5 0.6  PROT 6.9 6.4* 5.8* 6.0*  ALBUMIN 3.7 3.4* 3.1* 3.1*   Recent Labs  Lab 05/10/19 1613 05/12/19 0449  LIPASE 3,181* 57*   No results for input(s): AMMONIA in the last 168 hours. CBC: Recent Labs  Lab 05/10/19 1613 05/11/19 0523 05/12/19 0449 05/13/19 0338  WBC 6.9 6.4 4.5 9.9  NEUTROABS  --  4.4  --   --   HGB 13.5 12.6 11.5* 11.6*  HCT 43.1 39.5  35.6* 36.3  MCV 93.3 91.6 90.8 91.7  PLT 160 151 139* 153   Cardiac Enzymes: No results for input(s): CKTOTAL, CKMB, CKMBINDEX, TROPONINI in the last 168 hours. BNP: BNP (last 3 results) No  results for input(s): BNP in the last 8760 hours.  ProBNP (last 3 results) No results for input(s): PROBNP in the last 8760 hours.  CBG: No results for input(s): GLUCAP in the last 168 hours.     Signed:  Edsel PetrinMaryann Faolan [place]  [facility] 05/14/2019, 8:17 AM

## 2019-05-14 MED ORDER — POLYETHYLENE GLYCOL 3350 17 G PO PACK: 17.0000 g | PACK | Freq: Every day | ORAL | 0 refills | Status: DC | PRN

## 2019-05-14 MED ORDER — DOCUSATE SODIUM 100 MG PO CAPS: 100.0000 mg | ORAL_CAPSULE | Freq: Two times a day (BID) | ORAL | 0 refills | Status: DC | PRN

## 2019-05-14 NOTE — Plan of Care (Signed)
  Problem: Education: Goal: Knowledge of General Education information will improve Description: Including pain rating scale, medication(s)/side effects and non-pharmacologic comfort measures Outcome: Adequate for Discharge   

## 2019-05-14 NOTE — Progress Notes (Signed)
DISCHARGE NOTE HOME Jasmine [name] to be discharged Home per MD order. Discussed prescriptions and follow up appointments with the patient. Prescriptions given to patient; medication list explained in detail. Patient verbalized understanding.  Skin clean, dry and intact without evidence of skin break down, no evidence of skin tears noted. IV catheter discontinued intact. Site without signs and symptoms of complications. Dressing and pressure applied. Pt denies pain at the site currently. No complaints noted.  Patient free of lines, drains, and wounds.   An After Visit Summary (AVS) was printed and given to the patient. Patient escorted via wheelchair, and discharged home via private auto.  Leonia Reeves, RN

## 2019-05-16 NOTE — Anesthesia Postprocedure Evaluation (Signed)
Anesthesia Post Note  Patient: Jasmine [name]  Procedure(s) Performed: LAPAROSCOPIC CHOLECYSTECTOMY WITH INTRAOPERATIVE CHOLANGIOGRAM (N/A Abdomen)     Patient location during evaluation: PACU Anesthesia Type: General Level of consciousness: awake and alert Pain management: pain level controlled Vital Signs Assessment: post-procedure vital signs reviewed and stable Respiratory status: spontaneous breathing, nonlabored ventilation, respiratory function stable and patient connected to nasal cannula oxygen Cardiovascular status: blood pressure returned to baseline and stable Postop Assessment: no apparent nausea or vomiting Anesthetic complications: no    Last Vitals:  Vitals:   05/13/19 2116 05/14/19 0534  BP: 110/63 (!) 95/58  Pulse: 83 70  Resp: 18 18  Temp: 37.1 C   SpO2: 100% 96%    Last Pain:  Vitals:   05/14/19 0815  TempSrc:   PainSc: 7                  Kirt Chew S

## 2019-05-20 ENCOUNTER — Encounter (HOSPITAL_COMMUNITY): Payer: Self-pay

## 2019-05-20 ENCOUNTER — Other Ambulatory Visit: Payer: Self-pay

## 2019-05-20 ENCOUNTER — Emergency Department (HOSPITAL_COMMUNITY)
Admission: EM | Admit: 2019-05-20 | Discharge: 2019-05-20 | Disposition: A | Discharge status: Home / Self Care | Payer: BC Managed Care – PPO | Attending: Emergency Medicine | Admitting: Emergency Medicine

## 2019-05-20 DIAGNOSIS — K6289 Other specified diseases of anus and rectum: Secondary | ICD-10-CM | POA: Diagnosis present

## 2019-05-20 DIAGNOSIS — Z79899 Other long term (current) drug therapy: Secondary | ICD-10-CM | POA: Insufficient documentation

## 2019-05-20 DIAGNOSIS — B3789 Other sites of candidiasis: Secondary | ICD-10-CM | POA: Insufficient documentation

## 2019-05-20 DIAGNOSIS — J45909 Unspecified asthma, uncomplicated: Secondary | ICD-10-CM | POA: Insufficient documentation

## 2019-05-20 MED ORDER — FLUCONAZOLE 150 MG PO TABS
150.0000 mg | ORAL_TABLET | Freq: Once | ORAL | Status: AC
  Administered 2019-05-20: 150 mg via ORAL
  Filled 2019-05-20: qty 1

## 2019-05-20 MED ORDER — NYSTATIN 100000 UNIT/GM EX CREA: TOPICAL_CREAM | CUTANEOUS | 0 refills | Status: DC
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
## 2019-05-20 NOTE — Discharge Instructions (Signed)
Keep the area clean and dry.  Follow-up with your family doctor in the office.  Please return for rapid spreading redness or fever.

## 2019-05-20 NOTE — ED Triage Notes (Signed)
Pt arrives to ED w/ c/o itchiness of the rectum. States she thinks she has hemorrhoids.

## 2020-09-18 ENCOUNTER — Other Ambulatory Visit: Payer: Self-pay

## 2020-09-18 ENCOUNTER — Other Ambulatory Visit: Payer: Self-pay | Admitting: Family Medicine

## 2020-09-18 ENCOUNTER — Ambulatory Visit
Admission: RE | Admit: 2020-09-18 | Discharge: 2020-09-18 | Disposition: A | Discharge status: Home / Self Care | Payer: BC Managed Care – PPO | Source: Ambulatory Visit | Attending: Family Medicine | Admitting: Family Medicine

## 2020-09-18 DIAGNOSIS — R52 Pain, unspecified: Secondary | ICD-10-CM

## 2020-09-21 ENCOUNTER — Ambulatory Visit: Payer: BC Managed Care – PPO | Admitting: Podiatry

## 2020-09-21 ENCOUNTER — Other Ambulatory Visit: Payer: Self-pay

## 2020-09-21 DIAGNOSIS — S92502A Displaced unspecified fracture of left lesser toe(s), initial encounter for closed fracture: Secondary | ICD-10-CM | POA: Diagnosis not present

## 2020-09-25 ENCOUNTER — Ambulatory Visit: Payer: BC Managed Care – PPO | Admitting: Podiatry

## 2020-09-26 ENCOUNTER — Ambulatory Visit: Payer: BC Managed Care – PPO | Admitting: Podiatry

## 2020-09-26 ENCOUNTER — Encounter: Payer: Self-pay | Admitting: Podiatry

## 2020-09-26 NOTE — Progress Notes (Signed)
Subjective:  Patient ID: Jasmine [name], female    DOB: 22-Apr-1980,  MRN: [id number]  Chief Complaint  Patient presents with   Fracture    Left foot 5th toe fracture     40 y.o. female presents with the above complaint.  Patient presents with complaint left fifth digit pain.  Patient states that she underwent a left fifth toe fracture.  She states that it is painful to touch painful to walk on.  Has not gotten any better.  She states she stubbed it and has now gotten better.  She had an x-ray done at the urgent care which showed a fracture of the fifth digit.  She denies any other acute complaint she would like to discuss other treatment options she has not been buddy splinting or wearing surgical shoe.   Review of Systems: Negative except as noted in the HPI. Denies N/V/F/Ch.  Past Medical History:  Diagnosis Date   Active labor 01/07/2014   Allergic rhinitis    Asthma    Pancreatitis 04/2019   Postpartum care following vaginal delivery (11/14) 01/08/2014   Thrombocytopenia ([facility]) 12/19/2013    Current Outpatient Medications:    albuterol (PROAIR HFA) 108 (90 Base) MCG/ACT inhaler, Inhale 2 puffs into the lungs every 4 (four) hours as needed for wheezing or shortness of breath., Disp: 8.5 Inhaler, Rfl: 2   docusate sodium (COLACE) 100 MG capsule, Take 1 capsule (100 mg total) by mouth 2 (two) times daily as needed for mild constipation., Disp: 10 capsule, Rfl: 0   fluticasone (FLONASE) 50 MCG/ACT nasal spray, SPRAY 2 SPRAYS INTO EACH NOSTRIL EVERY DAY (Patient taking differently: Place 2 sprays into both nostrils daily. ), Disp: 48 g, Rfl: 1   ibuprofen (ADVIL) 800 MG tablet, Take 1 tablet (800 mg total) by mouth every 8 (eight) hours as needed., Disp: 50 tablet, Rfl: 0   KARIVA 0.15-0.02/0.01 MG (21/5) tablet, Take 1 tablet by mouth daily., Disp: , Rfl: 11   nystatin cream (MYCOSTATIN), Apply to affected area 2 times daily, Disp: 15 g, Rfl: 0   omeprazole (PRILOSEC) 20 MG capsule,  Take 1 capsule (20 mg total) by mouth daily., Disp: 30 capsule, Rfl: 0   oxyCODONE (OXY IR/ROXICODONE) 5 MG immediate release tablet, Take 1 tablet (5 mg total) by mouth every 6 (six) hours as needed for up to 20 doses for severe pain. 1-2 Tabs PO q6h PRN pain, Disp: 20 tablet, Rfl: 0   OZEMPIC, 1 MG/DOSE, 2 MG/1.5ML SOPN, Inject 0.75 mLs into the skin once a week., Disp: , Rfl:    polyethylene glycol (MIRALAX / GLYCOLAX) 17 g packet, Take 17 g by mouth daily as needed for mild constipation., Disp: 14 each, Rfl: 0  Social History   Tobacco Use  Smoking Status Never  Smokeless Tobacco Never    No Known Allergies Objective:  There were no vitals filed for this visit. There is no height or weight on file to calculate BMI. Constitutional Well developed. Well nourished.  Vascular Dorsalis pedis pulses palpable bilaterally. Posterior tibial pulses palpable bilaterally. Capillary refill normal to all digits.  No cyanosis or clubbing noted. Pedal hair growth normal.  Neurologic Normal speech. Oriented to person, place, and time. Epicritic sensation to light touch grossly present bilaterally.  Dermatologic Nails well groomed and normal in appearance. No open wounds. No skin lesions.  Orthopedic: Pain on palpation left fifth digit.  Mild ecchymosis noted.  Pain with range of motion of the fifth digit.  No pain at  the metatarsal.   Radiographs: Nondisplaced extra-articular fracture through the proximal phalanx of the fifth toe. Assessment:   1. Closed fracture of phalanx of left fifth toe, initial encounter    Plan:  Patient was evaluated and treated and all questions answered.  Left fifth digit fracture closed -I explained patient etiology of the fracture and various treatment options were discussed.  Given that the pain is improving slowly however there is still ecchymosis and pain left I believe patient will benefit from buddy splinting.  I showed her/demonstrated how to properly  buddy splint.  She states that she will start doing that immediately. -I will also place her in surgical shoe.  Surgical shoe was dispensed.  No follow-ups on file.

## 2020-10-17 ENCOUNTER — Ambulatory Visit: Payer: BC Managed Care – PPO | Admitting: Podiatry

## 2020-11-27 NOTE — Progress Notes (Signed)
NEW PATIENT Date of Service/Encounter:  11/28/20 Referring provider: Renaye Rakers, MD Primary care provider: Renaye Rakers, MD  Subjective:  Jasmine [name] is a 40 y.o. female with a PMHx of chronic rhinitis, asthma presenting today for evaluation of chronic rhinitis. History obtained from: chart review and patient.   Patient reports for the last 2 to 3 years will have coughing episodes occurring at least 1-2 times per year.  Cough is unproductive, and she describes it as needing to clear her airways.  She feels like the cough is coming from her throat.  Occasionally associates shortness of breath and wheezing.  Sometimes associated with illness, but also happens sporadically.  When occurs will last for 6 weeks or more.  When occurs will wake her up at night.  She does have year-round watery eyes but was told by ophthalmology that this is due to small tear ducts.  Does not endorse allergic rhinitis, but has been told on several occasions that she has drainage noted on exam during these coughing periods. Has a history of reflux, but does not take anything for this. She has tried multiple medications including prednisone, codeine cough syrup, maybe an inhaler, and maybe nasal steroids.  She is not exactly sure what all she has tried, but feels nothing has worked.  She does have a history of asthma which she reports is mostly controlled.  She she has both a controller inhaler Markus Daft) as well as albuterol which she uses during periods of illness.  She uses these 1-2 times per year for asthma.  She feels that her chronic cough is different from her asthma spells.  Other allergy screening: Food allergy: no Medication allergy: no Hymenoptera allergy: no Urticaria: no Eczema:no  Past Medical History: Past Medical History:  Diagnosis Date   Active labor 01/07/2014   Allergic rhinitis    Asthma    Pancreatitis 04/2019   Postpartum care following vaginal delivery (11/14) 01/08/2014    Thrombocytopenia ([facility]) 12/19/2013   Medication List:  Current Outpatient Medications  Medication Sig Dispense Refill   albuterol (PROAIR HFA) 108 (90 Base) MCG/ACT inhaler Inhale 2 puffs into the lungs every 4 (four) hours as needed for wheezing or shortness of breath. 8.5 Inhaler 2   fluticasone (FLONASE) 50 MCG/ACT nasal spray Place 2 sprays into both nostrils daily. 48 g 3   omeprazole (PRILOSEC) 40 MG capsule Take 1 capsule (40 mg total) by mouth daily. 30 capsule 2   No current facility-administered medications for this visit.   Known Allergies:  No Known Allergies Past Surgical History: Past Surgical History:  Procedure Laterality Date   CHOLECYSTECTOMY N/A 05/12/2019   Procedure: LAPAROSCOPIC CHOLECYSTECTOMY WITH INTRAOPERATIVE CHOLANGIOGRAM;  Surgeon: Kinsinger, De Blanch, MD;  Location: [facility];  Service: General;  Laterality: N/A;   tubal resection     Family History: Family History  Problem Relation Age of Onset   Cancer Father    Diabetes Brother    Social History: Afifa lives in a house, Futures trader, gas heating, central AC, pet dog, no visible cockroaches,.  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  No HEPA filter in the home.  [place]  She denies smoking history.  Denies secondhand smoke exposure.   ROS:  All other systems negative except as noted per HPI.  Objective:  Blood pressure 118/70, pulse 83, temperature 98.8 F (37.1 C), temperature source Temporal, resp. rate 16, height 5\' 5"  (1.651 m), weight 206 lb 3.2 oz (  93.5 kg), SpO2 98 %, currently breastfeeding. Body mass index is 34.31 kg/m. Physical Exam:  General Appearance:  Alert, cooperative, no distress, appears stated age  Head:  Normocephalic, without obvious abnormality, atraumatic  Eyes:  Conjunctiva clear, EOM's intact  Nose: Nares normal, hypertrophic edematous bilateral turbinates with clear mucus  Throat: Lips, tongue normal; teeth and gums normal,  normal posterior oropharynx  Neck: Supple, symmetrical  Lungs:   Clear to auscultation bilaterally, respirations unlabored, no coughing  Heart:  Regular rate and rhythm, appears well perfused  Extremities: No edema  Skin: Skin color, texture, turgor normal, no rashes or lesions on visualized portions of skin  Neurologic: No gross deficits   Diagnostics: Spirometry:  Tracings reviewed. Her effort: was good FVC: 1.84L (pre), 1.98L  (post) FEV1: 1.75L, 67% predicted (pre), 1.62L, 60% predicted (post) FEV1/FVC ratio: 114% (pre), 99% (post) Interpretation: Spirometry consistent with possible restrictive disease with out bronchodilator response  Please see scanned spirometry results for details.  Skin Testing: Environmental allergy panel. Adequate positive and negative controls Results discussed with patient/family.  Airborne Adult Perc - 11/28/20 1600     Time Antigen Placed 1615    Allergen Manufacturer Waynette Buttery    Location Back    Number of Test 59    1. Control-Buffer 50% Glycerol Negative    2. Control-Histamine 1 mg/ml 3+    3. Albumin saline Negative    4. Bahia 4+    5. [place] 3+    6. Johnson 3+    7. Kentucky Blue Negative    8. Meadow Fescue 3+    9. Perennial Rye 4+    10. Sweet Vernal 4+    11. Timothy 4+    12. Cocklebur Negative    13. Burweed Marshelder Negative    14. Ragweed, short Negative    15. Ragweed, Giant Negative    16. Plantain,  English 2+    17. Lamb's Quarters Negative    18. Sheep Sorrell Negative    19. Rough Pigweed Negative    20. Marsh Elder, Rough Negative    21. Mugwort, Common Negative    22. Ash mix Negative    23. Birch mix Negative    24. Beech American Negative    25. Box, Elder Negative    26. Cedar, red Negative    27. Cottonwood, Guinea-Bissau Negative    28. Elm mix Negative    29. Hickory 2+    30. Maple mix Negative    31. Oak, Guinea-Bissau mix 3+    32. Pecan Pollen 3+    33. Pine mix Negative    34. Sycamore Eastern Negative     35. Walnut, Black Pollen Negative    36. Alternaria alternata Negative    37. Cladosporium Herbarum Negative    38. Aspergillus mix Negative    39. Penicillium mix Negative    41. Drechslera spicifera (Curvularia) Negative    42. Mucor plumbeus Negative    43. Fusarium moniliforme 2+    44. Aureobasidium pullulans (pullulara) 2+    45. Rhizopus oryzae Negative    46. Botrytis cinera Negative    47. Epicoccum nigrum Negative    48. Phoma betae Negative    49. Candida Albicans Negative    50. Trichophyton mentagrophytes Negative    51. Mite, D Farinae  5,000 AU/ml Negative    52. Mite, D Pteronyssinus  5,000 AU/ml Negative    53. Cat Hair 10,000 BAU/ml Negative    54.  Dog Epithelia Negative  55. Mixed Feathers Negative    56. Horse Epithelia Negative    57. Cockroach, German Negative    58. Mouse 2+    59. Tobacco Leaf Negative             Allergy testing results were read and interpreted by myself, documented by clinical staff.  Assessment:  Chronic cough - Plan: Spirometry with Graph, Allergy Test Multiple etiologies considered for her chronic cough.  Specific if we consider cough variant asthma, allergic and nonallergic rhinitis leading to drainage, GERD/LPR, neurogenic and habit cough. Her spirometry today showed restriction without significant bronchodilator response.  Not suggestive of cough variant asthma at this time, but would still like to trial albuterol as well as older inhaler during coughing periods.  May consider full PFTs and/or chest x-ray at next visit if these are not helpful. She also had multiple positive aeroallergens on skin prick testing today, and has a longstanding history of reflux making upper airway cough most likely.  We will start therapy for postnasal drainage and reflux today with the aims of preventing a coughing spell.  Allergic rhinitis due to pollen, unspecified seasonality - Plan: fluticasone (FLONASE) 50 MCG/ACT nasal  spray  Gastroesophageal reflux disease, unspecified whether esophagitis present - Plan: omeprazole (PRILOSEC) 40 MG capsule Plan/Recommendations:   Patient Instructions  Chronic Cough:  -This can be caused from a variety of things including cough variant asthma, allergic nasal inflammation, nonallergic nasal inflammation (irritants/perfumes/chemicals), reflux, nerve irritation in the throat - Unclear at this time which of these is the main contributor of your cough, but suspect multiple multiple likely causes including uncontrolled allergic nasal upper airway inflammation, reflux, and possibly cough variant asthma  - your lung testing today showed restriction - your allergy testing showed positives to grasses, trees, weeds, a few molds and mouse -For reflux, we will start omeprazole 40 mg daily - For allergic nasal symptoms, we will start Flonase 1 to 2 sprays daily. This is over the counter and may not be covered by your insurance - Allergen avoidance as below  -If cough returns and you are doing the above, add Breztri 2 puffs twice daily and continue until cough resolves -Can also try albuterol 2 puffs during periods of cough and make note if this is helpful  Follow-up in 2 months, sooner if needed.  This note in its entirety was forwarded to the Provider who requested this consultation.  Thank you for your kind referral. I appreciate the opportunity to take part in Aldeen's care. Please do not hesitate to contact me with questions.  Sincerely,  Tonny Bollman, MD [facility]

## 2020-11-28 ENCOUNTER — Ambulatory Visit: Payer: BC Managed Care – PPO | Admitting: Internal Medicine

## 2020-11-28 ENCOUNTER — Other Ambulatory Visit: Payer: Self-pay

## 2020-11-28 ENCOUNTER — Encounter: Payer: Self-pay | Admitting: Internal Medicine

## 2020-11-28 VITALS — BP 118/70 | HR 83 | Temp 98.8°F | Resp 16 | Ht 65.0 in | Wt 206.2 lb

## 2020-11-28 DIAGNOSIS — K219 Gastro-esophageal reflux disease without esophagitis: Secondary | ICD-10-CM | POA: Insufficient documentation

## 2020-11-28 DIAGNOSIS — J301 Allergic rhinitis due to pollen: Secondary | ICD-10-CM | POA: Diagnosis not present

## 2020-11-28 DIAGNOSIS — R053 Chronic cough: Secondary | ICD-10-CM | POA: Diagnosis not present

## 2020-11-28 MED ORDER — OMEPRAZOLE 40 MG PO CPDR: 40.0000 mg | DELAYED_RELEASE_CAPSULE | Freq: Every day | ORAL | 2 refills | Status: DC

## 2020-11-28 MED ORDER — FLUTICASONE PROPIONATE 50 MCG/ACT NA SUSP: 2.0000 | Freq: Every day | NASAL | 3 refills | Status: DC

## 2020-11-28 NOTE — Patient Instructions (Signed)
Chronic Cough:  -This can be caused from a variety of things including cough variant asthma, allergic nasal inflammation, nonallergic nasal inflammation (irritants/perfumes/chemicals), reflux, nerve irritation in the throat - Unclear at this time which of these is the main contributor of your cough, but suspect multiple multiple likely causes including uncontrolled allergic nasal upper airway inflammation, reflux, and possibly cough variant asthma  - your lung testing today showed restriction - your allergy testing showed positives to grasses, trees, weeds, a few molds and mouse -For reflux, we will start omeprazole 40 mg daily - For allergic nasal symptoms, we will start Flonase 1 to 2 sprays daily. This is over the counter and may not be covered by your insurance - Allergen avoidance as below  -If cough returns and you are doing the above, add Breztri 2 puffs twice daily and continue until cough resolves -Can also try albuterol 2 puffs during periods of cough and make note if this is helpful  Follow-up in 2 months, sooner if needed. ----------------------------------------------------------------------------------------------------- Reducing Pollen Exposure  The American Academy of Allergy, Asthma and Immunology suggests the following steps to reduce your exposure to pollen during allergy seasons.    Do not hang sheets or clothing out to dry; pollen may collect on these items. Do not mow lawns or spend time around freshly cut grass; mowing stirs up pollen. Keep windows closed at night.  Keep car windows closed while driving. Minimize morning activities outdoors, a time when pollen counts are usually at their highest. Stay indoors as much as possible when pollen counts or humidity is high and on windy days when pollen tends to remain in the air longer. Use air conditioning when possible.  Many air conditioners have filters that trap the pollen spores. Use a HEPA room air filter to remove  pollen form the indoor air you breathe.  Control of Mold Allergen   Mold and fungi can grow on a variety of surfaces provided certain temperature and moisture conditions exist.  Outdoor molds grow on plants, decaying vegetation and soil.  The major outdoor mold, Alternaria and Cladosporium, are found in very high numbers during hot and dry conditions.  Generally, a late Summer - Fall peak is seen for common outdoor fungal spores.  Rain will temporarily lower outdoor mold spore count, but counts rise rapidly when the rainy period ends.  The most important indoor molds are Aspergillus and Penicillium.  Dark, humid and poorly ventilated basements are ideal sites for mold growth.  The next most common sites of mold growth are the bathroom and the kitchen.  Maintain humidity below 50%. Clean washable surfaces with 5% bleach solution. Remove sources e.g. contaminated carpets.

## 2021-01-29 NOTE — Progress Notes (Deleted)
FOLLOW UP Date of Service/Encounter:  01/29/21   Subjective:  Jasmine [name] (DOB: 1980-11-10) is a 40 y.o. female [facility] in re-evaluation of the following: chronic cough History obtained from: chart review and {Persons; PED relatives w/patient:19415::"patient"}.  For Review, LV was on 11/28/20  with Dr.Christyne Mccain seen for chronic cough with history of asthma.  Cough suspected to have major component of upper airway syndrome.  Started omeprazole 40 mg daily for reflux, and Flonase 1-2 SEN.  Plan to try Breztri 2 puffs BID if cough returned given previous history of asthma.   Pertinent history/diagnostics:  - 11/28/20 spirometry (pre/post): ratio 119%, FEV1 67%, no bronchodilator response - SPT environmental: + grasses, weeds, trees, molds, mouse - AEC on 05/11/19: 100  Today presents for follow-up. ***  Allergies as of 01/30/2021   No Known Allergies      Medication List        Accurate as of January 29, 2021  1:46 PM. If you have any questions, ask your nurse or doctor.          albuterol 108 (90 Base) MCG/ACT inhaler Commonly known as: ProAir HFA Inhale 2 puffs into the lungs every 4 (four) hours as needed for wheezing or shortness of breath.   fluticasone 50 MCG/ACT nasal spray Commonly known as: FLONASE Place 2 sprays into both nostrils daily.   omeprazole 40 MG capsule Commonly known as: PRILOSEC Take 1 capsule (40 mg total) by mouth daily.       Past Medical History:  Diagnosis Date   Active labor 01/07/2014   Allergic rhinitis    Asthma    Pancreatitis 04/2019   Postpartum care following vaginal delivery (11/14) 01/08/2014   Thrombocytopenia (HCC) 12/19/2013   Past Surgical History:  Procedure Laterality Date   CHOLECYSTECTOMY N/A 05/12/2019   Procedure: LAPAROSCOPIC CHOLECYSTECTOMY WITH INTRAOPERATIVE CHOLANGIOGRAM;  Surgeon: Sheliah Hatch De Blanch, MD;  Location: [facility] OR;  Service: General;  Laterality:  N/A;   tubal resection     Otherwise, there have been no changes to her past medical history, surgical history, family history, or social history.  ROS: All others negative except as noted per HPI.   Objective:  There were no vitals taken for this visit. There is no height or weight on file to calculate BMI. Physical Exam: General Appearance:  Alert, cooperative, no distress, appears stated age  Head:  Normocephalic, without obvious abnormality, atraumatic  Eyes:  Conjunctiva clear, EOM's intact  Nose: Nares normal  Throat: Lips, tongue normal; teeth and gums normal  Neck: Supple, symmetrical  Lungs:   Respirations unlabored, no coughing  Heart:  Appears well perfused  Extremities: No edema  Skin: Skin color, texture, turgor normal, no rashes or lesions on visualized portions of skin  Neurologic: No gross deficits   Reviewed: ***  Spirometry:  Tracings reviewed. Her effort: {Blank single:19197::"Good reproducible efforts.","It was hard to get consistent efforts and there is a question as to whether this reflects a maximal maneuver.","Poor effort, data can not be interpreted."} FVC: ***L FEV1: ***L, ***% predicted FEV1/FVC ratio: ***% Interpretation: {Blank single:19197::"Spirometry consistent with mild obstructive disease","Spirometry consistent with moderate obstructive disease","Spirometry consistent with severe obstructive disease","Spirometry consistent with possible restrictive disease","Spirometry consistent with mixed obstructive and restrictive disease","Spirometry uninterpretable due to technique","Spirometry consistent with normal pattern","No overt abnormalities noted given today's efforts"}.  Please see scanned spirometry results for details.  Skin Testing: {Blank single:19197::"Select foods","Environmental allergy panel","Environmental allergy panel and select foods","Food allergy  panel","None","Deferred due to recent antihistamines use"}. Positive test to: ***.  Negative test to: ***.  Results discussed with patient/family.   {Blank single:19197::"Allergy testing results were read and interpreted by myself, documented by clinical staff."," "}  Assessment/Plan  There are no Patient Instructions on file for this visit.  No follow-ups on file.  Tonny Bollman, MD  [facility]

## 2021-01-30 ENCOUNTER — Ambulatory Visit: Payer: BC Managed Care – PPO | Admitting: Internal Medicine

## 2021-01-30 DIAGNOSIS — J309 Allergic rhinitis, unspecified: Secondary | ICD-10-CM

## 2021-02-19 ENCOUNTER — Other Ambulatory Visit: Payer: Self-pay | Admitting: Internal Medicine

## 2021-02-19 DIAGNOSIS — K219 Gastro-esophageal reflux disease without esophagitis: Secondary | ICD-10-CM

## 2021-05-15 ENCOUNTER — Ambulatory Visit
Admission: EM | Admit: 2021-05-15 | Discharge: 2021-05-15 | Disposition: A | Discharge status: Home / Self Care | Payer: BC Managed Care – PPO | Attending: Physician Assistant | Admitting: Physician Assistant

## 2021-05-15 ENCOUNTER — Ambulatory Visit (INDEPENDENT_AMBULATORY_CARE_PROVIDER_SITE_OTHER): Payer: BC Managed Care – PPO

## 2021-05-15 ENCOUNTER — Other Ambulatory Visit: Payer: Self-pay

## 2021-05-15 ENCOUNTER — Encounter: Payer: Self-pay | Admitting: Emergency Medicine

## 2021-05-15 DIAGNOSIS — R059 Cough, unspecified: Secondary | ICD-10-CM | POA: Diagnosis not present

## 2021-05-15 DIAGNOSIS — J209 Acute bronchitis, unspecified: Secondary | ICD-10-CM | POA: Diagnosis not present

## 2021-05-15 MED ORDER — PREDNISONE 20 MG PO TABS: 40.0000 mg | ORAL_TABLET | Freq: Every day | ORAL | 0 refills | Status: AC

## 2021-05-15 NOTE — ED Triage Notes (Signed)
Pt dis present today with a lingering cough. Pt states that her cough started last month  04/14/2021 when she tested positive for covid.  ?
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
## 2021-06-18 IMAGING — MR MR MRCP
9 of 13 series · 38 of 48 positions shown · non-contrast
Comparison: CT evaluation of 05/10/2019
COMPARISON: CT evaluation of 05/10/2019

Addendum:
CLINICAL DATA: Abdominal pain, suspected pancreatitis.

EXAM:
MRI ABDOMEN WITHOUT CONTRAST  (INCLUDING MRCP)
TECHNIQUE: Multiplanar multisequence MR imaging of the abdomen was performed.
Heavily T2-weighted images of the biliary and pancreatic ducts were
obtained, and three-dimensional MRCP images were rendered by post
processing.

[Series 4: ax haste · axial · 6.0mm · 1.19mm/px · z∈[-46,+199]mm · 2 of 35 slices shown]
[im 1/35]
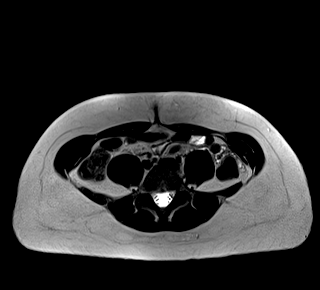
[im 35/35]
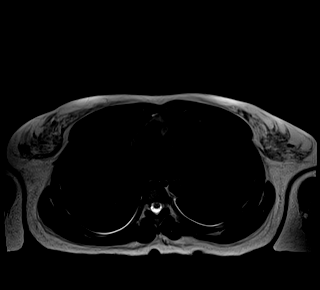

[Series 5: bSSFP · coronal · 6.0mm · 0.74mm/px · 2 of 29 slices shown]
[im 1/29]
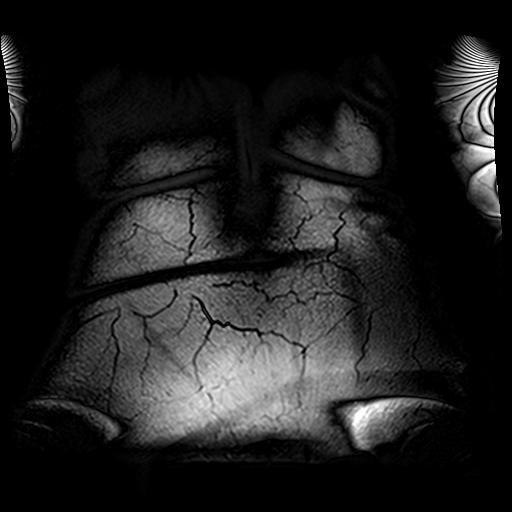
[im 29/29]
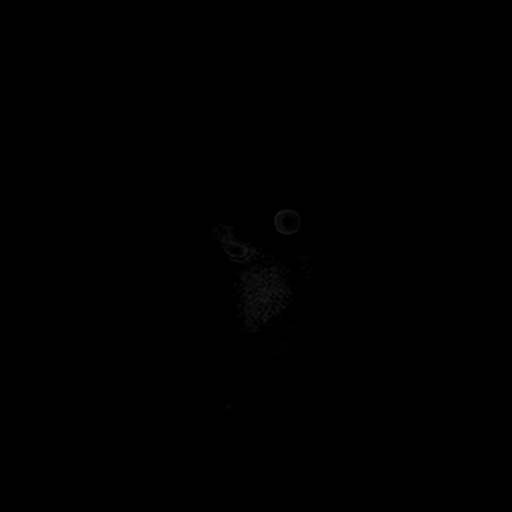

[Series 8: T2 fat-sat · axial · 6.0mm · 1.19mm/px · z∈[-38,+207]mm · 2 of 35 slices shown]
[im 1/35]
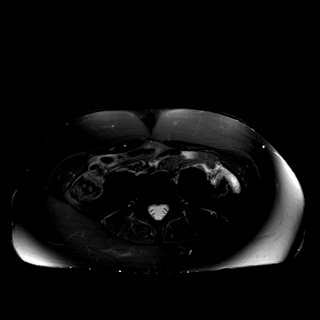
[im 35/35]
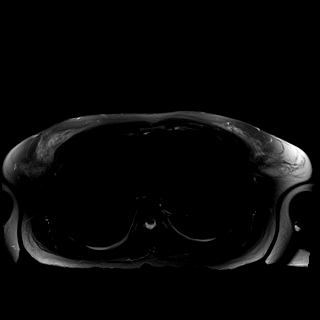

[Series 9: DWI · axial · 6.0mm · 1.42mm/px · z∈[-42,+203]mm · 10 of 105 slices shown (1 of 2)]
[im 1/105]
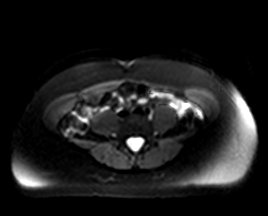
[im 12/105]
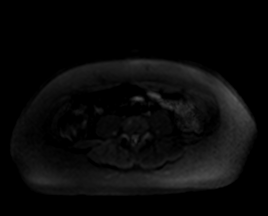
[im 24/105]
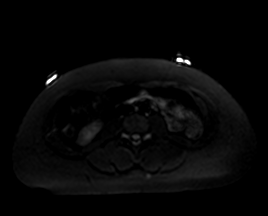
[im 35/105]
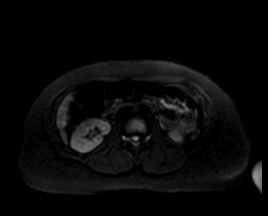
[im 47/105]
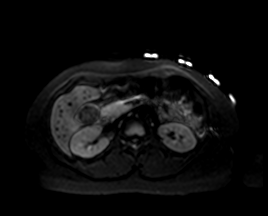
[im 58/105]
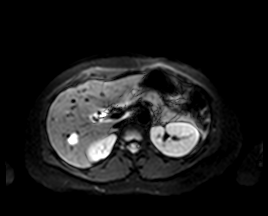
[im 70/105]
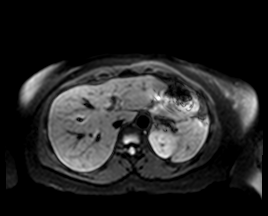
[im 81/105]
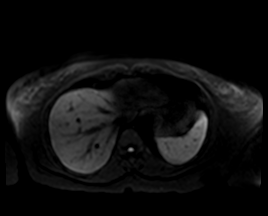
[im 93/105]
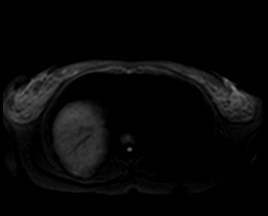
[im 105/105]
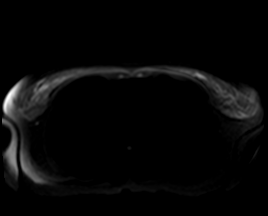

[Series 10: DWI · axial · 6.0mm · 1.42mm/px · z∈[-42,+203]mm · 3 of 35 slices shown (2 of 2)]
[im 1/35]
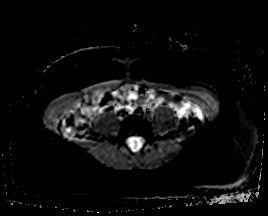
[im 18/35]
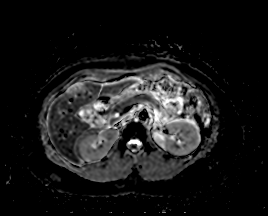
[im 35/35]
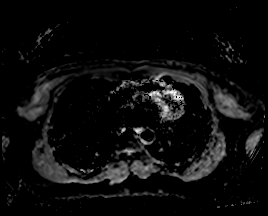

[Series 11: ax in and · axial · 3.0mm · 1.19mm/px · z∈[-22,+215]mm · 8 of 80 slices shown (1 of 2)]
[im 1/80]
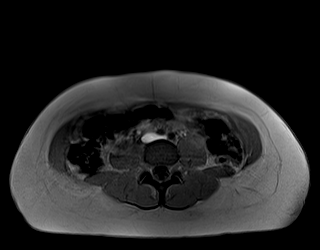
[im 12/80]
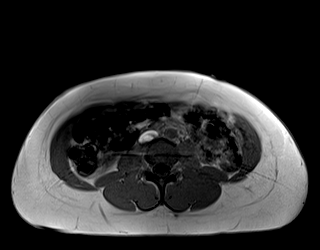
[im 23/80]
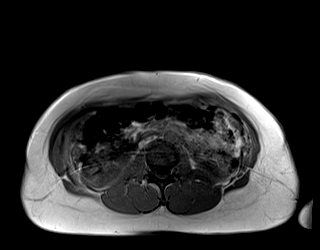
[im 34/80]
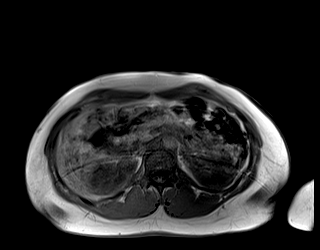
[im 46/80]
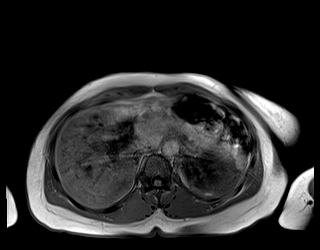
[im 57/80]
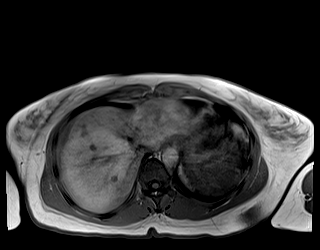
[im 68/80]
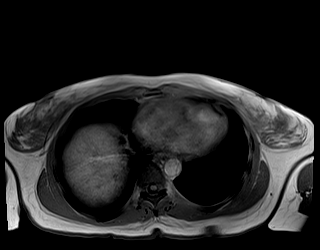
[im 80/80]
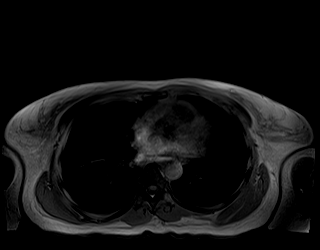

[Series 11: ax in and · axial · 3.0mm · 1.19mm/px · z∈[-22,+215]mm · 8 of 80 slices shown (2 of 2)]
[im 1/80]
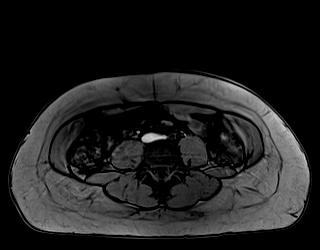
[im 12/80]
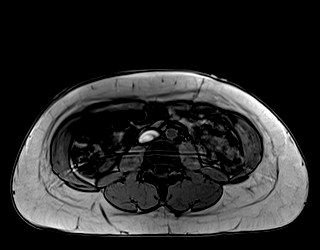
[im 23/80]
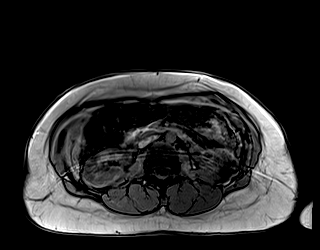
[im 34/80]
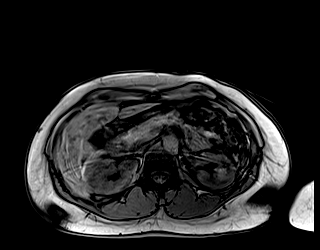
[im 46/80]
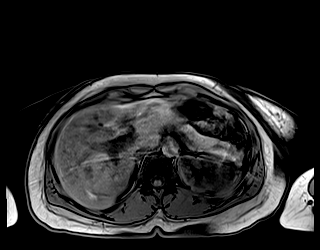
[im 57/80]
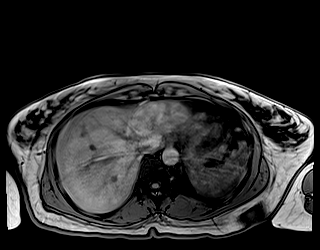
[im 68/80]
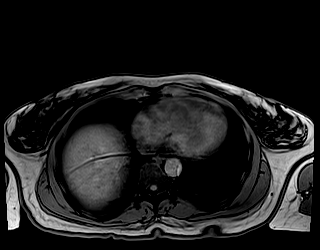
[im 80/80]
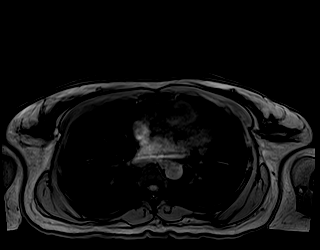

[Series 16: MRCP · coronal · 4.0mm · 1.12mm/px · 2 of 17 slices shown]
[im 1/17]
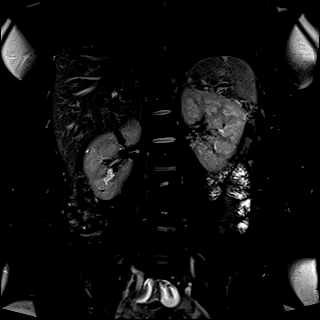
[im 17/17]
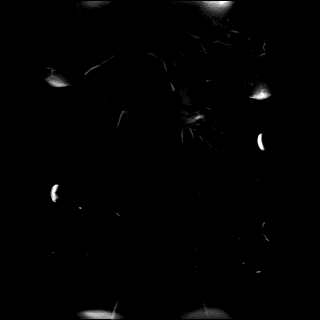

[Series 17: radials · coronal · 50.0mm · 0.78mm/px · 1 of 5 slices shown]
[im 1/5]
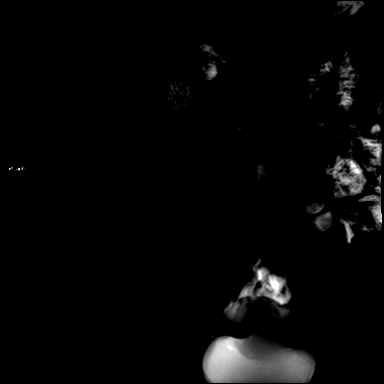

[38 of 48 positions shown; findings below may reference images not displayed]

FINDINGS: Lower chest: Incidental imaging of the lung bases is unremarkable,
limited assessment on MRI.

Hepatobiliary: Presumed hemangioma in the right hepatic lobe.
Similar to previous CT.

No signs of biliary ductal dilation. Common bile duct is normal,
without filling defect. Variant hepatic ductal anatomy with
posterior division right hepatic duct draining into the biliary
confluence from the superior liver and inferior right hepatic duct
draining just below the biliary confluence but well above the cystic
duct confluence.

Numerous filling defects in the gallbladder and cystic duct, a large
stone in the neck of the gallbladder measuring approximately 1-1.2
cm. Small amount of pericholecystic fluid. Contrast was not
administered, this limits assessment of gallbladder wall.

Pancreas: Little if any pancreatic edema. No focal peripancreatic
fluid.

Spleen:  Normal size spleen, no focal lesion.

Adrenals/Urinary Tract: Normal appearance of the adrenal glands.
Kidneys with smooth renal contours. No signs of hydronephrosis.

Stomach/Bowel: Limited assessment of the gastrointestinal tract on
MRI. No signs of bowel obstruction or acute bowel process

Vascular/Lymphatic: Lack of contrast limits assessment. Vascular
structures are grossly patent.

Other:  None

Musculoskeletal: No suspicious bone lesions identified.
IMPRESSION: 1. Distended gallbladder filled with numerous gallstones with a
large gallstone in the neck of the gallbladder as described. Small
amount of pericholecystic fluid. Findings could indicate early acute
calculus cholecystitis. HIDA scan may be useful for further
assessment as clinically warranted.
2. Minimal pancreatic edema. Correlate with pancreatic enzymes. No
ductal dilation or peripancreatic fluid.
3. Variant hepatic ductal anatomy as described above.

ADDENDUM:
Presumed hepatic hemangioma as described.

*** End of Addendum ***
FINDINGS: Lower chest: Incidental imaging of the lung bases is unremarkable,
limited assessment on MRI.

Hepatobiliary: Presumed hemangioma in the right hepatic lobe.
Similar to previous CT.

No signs of biliary ductal dilation. Common bile duct is normal,
without filling defect. Variant hepatic ductal anatomy with
posterior division right hepatic duct draining into the biliary
confluence from the superior liver and inferior right hepatic duct
draining just below the biliary confluence but well above the cystic
duct confluence.

Numerous filling defects in the gallbladder and cystic duct, a large
stone in the neck of the gallbladder measuring approximately 1-1.2
cm. Small amount of pericholecystic fluid. Contrast was not
administered, this limits assessment of gallbladder wall.

Pancreas: Little if any pancreatic edema. No focal peripancreatic
fluid.

Spleen:  Normal size spleen, no focal lesion.

Adrenals/Urinary Tract: Normal appearance of the adrenal glands.
Kidneys with smooth renal contours. No signs of hydronephrosis.

Stomach/Bowel: Limited assessment of the gastrointestinal tract on
MRI. No signs of bowel obstruction or acute bowel process

Vascular/Lymphatic: Lack of contrast limits assessment. Vascular
structures are grossly patent.

Other:  None

Musculoskeletal: No suspicious bone lesions identified.
IMPRESSION: 1. Distended gallbladder filled with numerous gallstones with a
large gallstone in the neck of the gallbladder as described. Small
amount of pericholecystic fluid. Findings could indicate early acute
calculus cholecystitis. HIDA scan may be useful for further
assessment as clinically warranted.
2. Minimal pancreatic edema. Correlate with pancreatic enzymes. No
ductal dilation or peripancreatic fluid.
3. Variant hepatic ductal anatomy as described above.

## 2021-06-28 ENCOUNTER — Ambulatory Visit
Admission: EM | Admit: 2021-06-28 | Discharge: 2021-06-28 | Disposition: A | Discharge status: Home / Self Care | Payer: BC Managed Care – PPO | Attending: Internal Medicine | Admitting: Internal Medicine

## 2021-06-28 DIAGNOSIS — R1011 Right upper quadrant pain: Secondary | ICD-10-CM
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
## 2021-06-28 NOTE — Discharge Instructions (Signed)
Blood work is pending.  We will call if it is abnormal.  Please go to the emergency department if symptoms persist or worsen.  I would recommend following up with primary care doctor today to schedule an appointment as I do think that imaging of the abdomen would be beneficial to determine cause of symptoms. ?
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
## 2021-06-29 LAB — CBC
Hematocrit: 41.2 % (ref 34.0–46.6)
Hemoglobin: 13.2 g/dL (ref 11.1–15.9)
MCH: 29.5 pg (ref 26.6–33.0)
MCHC: 32 g/dL (ref 31.5–35.7)
MCV: 92 fL (ref 79–97)
Platelets: 172 10*3/uL (ref 150–450)
RBC: 4.48 x10E6/uL (ref 3.77–5.28)
RDW: 12.8 % (ref 11.7–15.4)
WBC: 9 10*3/uL (ref 3.4–10.8)

## 2021-06-29 LAB — COMPREHENSIVE METABOLIC PANEL
ALT: 19 IU/L (ref 0–32)
AST: 19 IU/L (ref 0–40)
Albumin/Globulin Ratio: 1.7 (ref 1.2–2.2)
Albumin: 4.3 g/dL (ref 3.8–4.8)
Alkaline Phosphatase: 96 IU/L (ref 44–121)
BUN/Creatinine Ratio: 14 (ref 9–23)
BUN: 11 mg/dL (ref 6–24)
Bilirubin Total: 0.2 mg/dL (ref 0.0–1.2)
CO2: 26 mmol/L (ref 20–29)
Calcium: 8.7 mg/dL (ref 8.7–10.2)
Chloride: 103 mmol/L (ref 96–106)
Creatinine, Ser: 0.76 mg/dL (ref 0.57–1.00)
Globulin, Total: 2.5 g/dL (ref 1.5–4.5)
Glucose: 93 mg/dL (ref 70–99)
Potassium: 3.8 mmol/L (ref 3.5–5.2)
Sodium: 140 mmol/L (ref 134–144)
Total Protein: 6.8 g/dL (ref 6.0–8.5)
eGFR: 101 mL/min/{1.73_m2} (ref >59)

## 2021-06-29 LAB — LIPASE: Lipase: 27 U/L (ref 14–72)

## 2021-06-29 LAB — AMYLASE: Amylase: 100 U/L (ref 31–110)

## 2021-06-30 ENCOUNTER — Emergency Department (HOSPITAL_COMMUNITY): Payer: BC Managed Care – PPO

## 2021-06-30 ENCOUNTER — Encounter (HOSPITAL_COMMUNITY): Payer: Self-pay | Admitting: *Deleted

## 2021-06-30 ENCOUNTER — Emergency Department (HOSPITAL_COMMUNITY)
Admission: EM | Admit: 2021-06-30 | Discharge: 2021-06-30 | Disposition: A | Discharge status: Home / Self Care | Payer: BC Managed Care – PPO | Attending: Emergency Medicine | Admitting: Emergency Medicine

## 2021-06-30 ENCOUNTER — Other Ambulatory Visit: Payer: Self-pay

## 2021-06-30 DIAGNOSIS — R1011 Right upper quadrant pain: Secondary | ICD-10-CM | POA: Diagnosis not present

## 2021-06-30 DIAGNOSIS — R8289 Other abnormal findings on cytological and histological examination of urine: Secondary | ICD-10-CM | POA: Insufficient documentation

## 2021-06-30 LAB — URINALYSIS, ROUTINE W REFLEX MICROSCOPIC
Bilirubin Urine: NEGATIVE
Glucose, UA: NEGATIVE mg/dL
Hgb urine dipstick: NEGATIVE
Ketones, ur: NEGATIVE mg/dL
Nitrite: NEGATIVE
Protein, ur: NEGATIVE mg/dL
Specific Gravity, Urine: 1.023 (ref 1.005–1.030)
pH: 7 (ref 5.0–8.0)

## 2021-06-30 LAB — COMPREHENSIVE METABOLIC PANEL
ALT: 21 U/L (ref 0–44)
AST: 21 U/L (ref 15–41)
Albumin: 3.7 g/dL (ref 3.5–5.0)
Alkaline Phosphatase: 90 U/L (ref 38–126)
Anion gap: 6 (ref 5–15)
BUN: 15 mg/dL (ref 6–20)
CO2: 24 mmol/L (ref 22–32)
Calcium: 8.5 mg/dL — ABNORMAL LOW (ref 8.9–10.3)
Chloride: 107 mmol/L (ref 98–111)
Creatinine, Ser: 1.04 mg/dL — ABNORMAL HIGH (ref 0.44–1.00)
GFR, Estimated: >60 mL/min (ref >60)
Glucose, Bld: 86 mg/dL (ref 70–99)
Potassium: 3.8 mmol/L (ref 3.5–5.1)
Sodium: 137 mmol/L (ref 135–145)
Total Bilirubin: 0.6 mg/dL (ref 0.3–1.2)
Total Protein: 6.5 g/dL (ref 6.5–8.1)

## 2021-06-30 LAB — CBC
HCT: 41.4 % (ref 36.0–46.0)
Hemoglobin: 13.2 g/dL (ref 12.0–15.0)
MCH: 30.4 pg (ref 26.0–34.0)
MCHC: 31.9 g/dL (ref 30.0–36.0)
MCV: 95.4 fL (ref 80.0–100.0)
Platelets: 142 10*3/uL — ABNORMAL LOW (ref 150–400)
RBC: 4.34 MIL/uL (ref 3.87–5.11)
RDW: 13.8 % (ref 11.5–15.5)
WBC: 8.4 10*3/uL (ref 4.0–10.5)
nRBC: 0 % (ref 0.0–0.2)

## 2021-06-30 LAB — I-STAT BETA HCG BLOOD, ED (MC, WL, AP ONLY): I-stat hCG, quantitative: <5 m[IU]/mL (ref <5)

## 2021-06-30 LAB — LIPASE, BLOOD: Lipase: 40 U/L (ref 11–51)

## 2021-06-30 NOTE — ED Triage Notes (Signed)
PT states was seen at [facility] Friday for R lateral rib pain that started 3 weeks ago.  Pt initially felt pain when driving.  UC stated labs were normal, but they suggested follow up with PCP for [place].  ? ?Pt states pain is worse.  Denies changes in bowel or bladder habits. ?

## 2021-06-30 NOTE — ED Provider Notes (Signed)
?[facility] EMERGENCY DEPARTMENT ?Provider Note ? ? ?CSN: [id number] ?Arrival date & time: 06/30/21  1557 ? ?  ? ?History ? ?Chief Complaint  ?Patient presents with  ? Abdominal Pain  ? ? ?Yarexi KELSI BENHAM is a 41 y.o. female. ? ?HPI ? ?Patient is a 41 year old female with a history of cholecystectomy after pancreatitis complication who presents to the emergency department for 2 to 3 weeks of right upper quadrant pain.  She reports she noticed it while she was driving her car.  Initially it was mild and crampy feeling.  However it has progressively worsened.  She was seen at urgent care on 5/5 where they got lab work and discharge her after unremarkable work-up.  She denies associated vomiting or diarrhea.  Denies pain anywhere else.  Denies fever or chills.  Denies any direct injury to the area.  Denies a history of kidney stones.  Denies any urinary symptoms.  Denies dysuria or frequency.  Denies increased vaginal discharge.  She just finished her menstrual cycle a week ago.  Denies chest pain or shortness of breath.  Otherwise no other complaints. ? ?Home Medications ?Prior to Admission medications   ?Medication Sig Start Date End Date Taking? Authorizing Provider  ?albuterol (PROAIR HFA) 108 (90 Base) MCG/ACT inhaler Inhale 2 puffs into the lungs every 4 (four) hours as needed for wheezing or shortness of breath. 07/27/17   [place], Betty G, MD  ?fluticasone Aleda Grana) 50 MCG/ACT nasal spray Place 2 sprays into both nostrils daily. 11/28/20   Tonny Bollman, MD  ?omeprazole (PRILOSEC) 40 MG capsule TAKE 1 CAPSULE (40 MG TOTAL) BY MOUTH DAILY. 02/19/21   Tonny Bollman, MD  ?   ? ?Allergies    ?Patient has no known allergies.   ? ?Review of Systems   ?Review of Systems ? ?Physical Exam ?Updated Vital Signs ?BP 105/65   Pulse 85   Temp 98.7 ?F (37.1 ?C) (Oral)   Ht 5\' 6"  (1.676 m)   Wt 93 kg   LMP 06/18/2021   SpO2 100%   BMI 33.09 kg/m?  ?Physical Exam ?Vitals and nursing note reviewed.   ?Constitutional:   ?   General: She is not in acute distress. ?   Appearance: She is well-developed.  ?HENT:  ?   Head: Normocephalic and atraumatic.  ?Eyes:  ?   Conjunctiva/sclera: Conjunctivae normal.  ?Cardiovascular:  ?   Rate and Rhythm: Normal rate and regular rhythm.  ?   Heart sounds: No murmur heard. ?Pulmonary:  ?   Effort: Pulmonary effort is normal. No respiratory distress.  ?   Breath sounds: Normal breath sounds.  ?Abdominal:  ?   Palpations: Abdomen is soft.  ?   Tenderness: There is no abdominal tenderness. There is no right CVA tenderness, left CVA tenderness, guarding or rebound.  ?Musculoskeletal:     ?   General: No swelling.  ?   Cervical back: Neck supple.  ?Skin: ?   General: Skin is warm and dry.  ?   Capillary Refill: Capillary refill takes less than 2 seconds.  ?Neurological:  ?   Mental Status: She is alert.  ?Psychiatric:     ?   Mood and Affect: Mood normal.  ? ? ?ED Results / Procedures / Treatments   ?Labs ?(all labs ordered are listed, but only abnormal results are displayed) ?Labs Reviewed  ?URINALYSIS, ROUTINE W REFLEX MICROSCOPIC - Abnormal; Notable for the following components:  ?    Result Value  ? Leukocytes,Ua  TRACE (*)   ? Bacteria, UA RARE (*)   ? All other components within normal limits  ?CBC - Abnormal; Notable for the following components:  ? Platelets 142 (*)   ? All other components within normal limits  ?COMPREHENSIVE METABOLIC PANEL - Abnormal; Notable for the following components:  ? Creatinine, Ser 1.04 (*)   ? Calcium 8.5 (*)   ? All other components within normal limits  ?LIPASE, BLOOD  ?I-STAT BETA HCG BLOOD, ED ([facility], [facility], [facility] ONLY)  ? ? ?EKG ?None ? ?Radiology ?No results found. ? ?Procedures ?Procedures  ? ?Medications Ordered in ED ?Medications - No data to display ? ?ED Course/ Medical Decision Making/ A&P ?  ?                        ?Medical Decision Making ?Problems Addressed: ?RUQ pain: acute illness or injury that poses a threat to life or bodily  functions ? ?Amount and/or Complexity of Data Reviewed ?External Data Reviewed: labs and notes. ?Labs: ordered. Decision-making details documented in ED Course. ? ? ?Patient is a 41 year old female with a history of cholecystectomy presents to the emergency department for right upper quadrant pain for the past few weeks.  Per chart review, patient was seen in urgent care on 5/5.  She did get labs including CBC, CMP, and lipase and amylase.  All of which were grossly unremarkable.  Her physical exam without acute abdomen.  No guarding or rebound.  Her vital signs are within the reference range.  Patient presentation is unclear.  At this point I thought about hepatobiliary etiology despite her cholecystectomy.  Other differentials considered include cystitis, pyelonephritis, pancreatitis, nephrolithiasis, GI/GERD etiology.  I have low suspicion for serious bacterial infection or mesenteric ischemia at this time.  I also have low suspicion for appendicitis or ovarian etiologies.  Patient CBC without leukocytosis.  CMP without severe metabolic derangement.  No elevation of bilirubin or liver function test indicate obstructive etiology.  Lipase is unremarkable.  Urinalysis grossly unremarkable.  Trace leukocytes but no nitrites.  Less concern for UTI at this time.  No urine blood.  Urine pregnancy is negative.  Patient continued to remain stable.  On reassessment, her abdominal exam remains benign.  I believe her presentation is most consistent with musculoskeletal etiology.  I have offered her obtaining imaging with the possibility of low yield for any acute finding.  At this point patient elects to decline getting imaging.  She feels comfortable to go home.  I have discussed symptomatic management.  I have discussed strict return precaution.  Patient understand and agrees with plan. ?Final Clinical Impression(s) / ED Diagnoses ?Final diagnoses:  ?RUQ pain  ? ? ?Rx / DC Orders ?ED Discharge Orders   ? ? None  ? ?  ? ? ?   ?Jari Sportsman, MD ?06/30/21 1812 ? ?  ?Linwood Dibbles, MD ?07/01/21 1500 ? ?

## 2021-06-30 NOTE — Discharge Instructions (Signed)
You have been evaluated for right upper quadrant pain.  Your lab work including CBC, CMP, lipase and urinalysis did not show acute finding.  We believe this is most likely musculoskeletal.  Try taking Tylenol ibuprofen and mild stretching exercise.  In addition try hot compression.  Please return to the emergency department for severe pain or if you are having fever or worsening symptoms.  Thank you for your trust. ?

## 2021-07-02 ENCOUNTER — Other Ambulatory Visit: Payer: Self-pay | Admitting: Family Medicine

## 2021-07-02 DIAGNOSIS — K859 Acute pancreatitis without necrosis or infection, unspecified: Secondary | ICD-10-CM

## 2021-07-10 ENCOUNTER — Ambulatory Visit
Admission: RE | Admit: 2021-07-10 | Discharge: 2021-07-10 | Disposition: A | Discharge status: Home / Self Care | Payer: BC Managed Care – PPO | Source: Ambulatory Visit | Attending: Family Medicine | Admitting: Family Medicine

## 2021-07-10 DIAGNOSIS — K859 Acute pancreatitis without necrosis or infection, unspecified: Secondary | ICD-10-CM

## 2021-07-10 MED ORDER — IOPAMIDOL (ISOVUE-300) INJECTION 61%
100.0000 mL | Freq: Once | INTRAVENOUS | Status: AC | PRN
  Administered 2021-07-10: 100 mL via INTRAVENOUS

## 2022-06-06 ENCOUNTER — Ambulatory Visit (HOSPITAL_COMMUNITY)
Admission: EM | Admit: 2022-06-06 | Discharge: 2022-06-06 | Disposition: A | Discharge status: Home / Self Care | Payer: BC Managed Care – PPO | Attending: Emergency Medicine | Admitting: Emergency Medicine

## 2022-06-06 ENCOUNTER — Ambulatory Visit: Payer: BC Managed Care – PPO

## 2022-06-06 ENCOUNTER — Encounter (HOSPITAL_COMMUNITY): Payer: Self-pay

## 2022-06-06 DIAGNOSIS — J301 Allergic rhinitis due to pollen: Secondary | ICD-10-CM | POA: Diagnosis not present

## 2022-06-06 DIAGNOSIS — H1013 Acute atopic conjunctivitis, bilateral: Secondary | ICD-10-CM

## 2022-06-06 MED ORDER — CETIRIZINE HCL 10 MG PO TABS: 10.0000 mg | ORAL_TABLET | Freq: Every day | ORAL | 0 refills | Status: AC

## 2022-06-06 MED ORDER — FLUTICASONE PROPIONATE 50 MCG/ACT NA SUSP: 1.0000 | Freq: Every day | NASAL | 2 refills | Status: DC

## 2022-06-06 MED ORDER — BENZONATATE 100 MG PO CAPS: 100.0000 mg | ORAL_CAPSULE | Freq: Three times a day (TID) | ORAL | 0 refills | Status: DC

## 2022-06-06 MED ORDER — OLOPATADINE HCL 0.1 % OP SOLN: 1.0000 [drp] | Freq: Two times a day (BID) | OPHTHALMIC | 12 refills | Status: DC

## 2022-06-06 NOTE — ED Triage Notes (Signed)
Patient has history of seasonal allergies. Patient having a dry cough, post nasal drip, watery eyes. No fever, NVD. Patient having nasal congestion as well. Onset around easter.   Tried otc allergy with little relief.

## 2022-06-06 NOTE — Discharge Instructions (Addendum)
Please take the daily antihistamine Zyrtec to help with your allergic symptoms.  You can use the Tessalon Perles every 8 hours to suppress your cough.  Use the Flonase nasal spray and the eyedrops as prescribed.  Please return to clinic or follow-up with your primary care if your symptoms do not improve over the weekend.  Please return to clinic if you develop any shortness of breath, fever, productive cough, or any changes.
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
## 2023-07-21 ENCOUNTER — Ambulatory Visit
Admission: RE | Admit: 2023-07-21 | Discharge: 2023-07-21 | Disposition: A | Discharge status: Home / Self Care | Payer: Self-pay | Source: Ambulatory Visit

## 2023-07-21 VITALS — BP 112/76 | HR 74 | Temp 97.9°F | Resp 18 | Ht 65.0 in | Wt 190.0 lb

## 2023-07-21 DIAGNOSIS — J4541 Moderate persistent asthma with (acute) exacerbation: Secondary | ICD-10-CM

## 2023-07-21 DIAGNOSIS — R062 Wheezing: Secondary | ICD-10-CM

## 2023-07-21 DIAGNOSIS — R051 Acute cough: Secondary | ICD-10-CM | POA: Diagnosis not present

## 2023-07-21 MED ORDER — ALBUTEROL SULFATE (2.5 MG/3ML) 0.083% IN NEBU
2.5000 mg | INHALATION_SOLUTION | Freq: Once | RESPIRATORY_TRACT | Status: AC
  Administered 2023-07-21: 2.5 mg via RESPIRATORY_TRACT

## 2023-07-21 MED ORDER — PREDNISONE 20 MG PO TABS: 40.0000 mg | ORAL_TABLET | Freq: Every day | ORAL | 0 refills | Status: AC

## 2023-07-21 MED ORDER — BUDESONIDE-FORMOTEROL FUMARATE 80-4.5 MCG/ACT IN AERO: 2.0000 | INHALATION_SPRAY | Freq: Two times a day (BID) | RESPIRATORY_TRACT | 0 refills | Status: DC

## 2023-07-21 NOTE — ED Triage Notes (Signed)
 Pt states that she has coughing and chest tightness with cough. X1 week
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
## 2023-07-21 NOTE — Discharge Instructions (Signed)
 We are treating you for asthma.  Continue using albuterol  every 4-6 hours as needed.  Start Symbicort twice a day.  Rinse your mouth following use of this medication to prevent thrush.  Start prednisone  40 mg for 5 days.  Do not take NSAIDs with this medication including aspirin, ibuprofen /Advil , naproxen/Aleve.  You can use over-the-counter medication including Mucinex, Flonase , Tylenol , nasal saline/sinus rinses.  Follow-up with your primary care next week.  If anything worsens and you have high fever, worsening cough, shortness of breath, chest pain you need to be seen immediately.

## 2023-07-22 ENCOUNTER — Other Ambulatory Visit: Payer: Self-pay

## 2023-07-22 DIAGNOSIS — J45909 Unspecified asthma, uncomplicated: Secondary | ICD-10-CM

## 2023-07-22 MED ORDER — ALBUTEROL SULFATE HFA 108 (90 BASE) MCG/ACT IN AERS
2.0000 | INHALATION_SPRAY | RESPIRATORY_TRACT | 0 refills | Status: AC | PRN
Start: 2023-07-22 — End: ?

## 2023-07-22 NOTE — Telephone Encounter (Signed)
 Incoming call/information:  MRN# [id number], last name : Tegeler and she needs an abuterol added on with her nebulizer, phone # [phone]  Outgoing call/information reviewed:  Patient states she though she had the Abluterol Inhaler (as discussed with provider on 05-27) but did not have it after all and requests it be sent at the pharmacy below for use (as advised).  Sent to Jami Mcclintock for review/advise/rx.  Pharmacy: [facility] [address] [facility]   B. Yvonnie Schinke CMA

## 2023-08-09 NOTE — Progress Notes (Unsigned)
 Follow Up Note  RE: Jasmine [name] MRN: [id number] DOB: Apr 17, 1980 Date of Office Visit: 08/10/2023  Referring provider: Jonathon Neighbors, MD Primary care provider: Jonathon Neighbors, MD  Chief Complaint: No chief complaint on file.  History of Present Illness: I had the pleasure of seeing Jasmine [name] for a follow up visit at the [facility] on 08/10/2023. She is a 43 y.o. female, who is being followed for allergic rhinitis, GERD and cough. Her previous allergy office visit was on 11/28/2020 with Dr. Cornel Diesel. Today is a new complaint visit of ***.  Discussed the use of AI scribe software for clinical note transcription with the patient, who gave verbal consent to proceed.  History of Present Illness            Chronic cough - Plan: Spirometry with Graph, Allergy Test Multiple etiologies considered for her chronic cough.  Specific if we consider cough variant asthma, allergic and nonallergic rhinitis leading to drainage, GERD/LPR, neurogenic and habit cough. Her spirometry today showed restriction without significant bronchodilator response.  Not suggestive of cough variant asthma at this time, but would still like to trial albuterol  as well as older inhaler during coughing periods.  May consider full PFTs and/or chest x-ray at next visit if these are not helpful. She also had multiple positive aeroallergens on skin prick testing today, and has a longstanding history of reflux making upper airway cough most likely.  We will start therapy for postnasal drainage and reflux today with the aims of preventing a coughing spell.   Allergic rhinitis due to pollen, unspecified seasonality - Plan: fluticasone  (FLONASE ) 50 MCG/ACT nasal spray   Gastroesophageal reflux disease, unspecified whether esophagitis present - Plan: omeprazole  (PRILOSEC) 40 MG capsule  Assessment and Plan: Jasmine [name] is a 43 y.o. female with: *** Assessment and Plan              No follow-ups on  file.  No orders of the defined types were placed in this encounter.  Lab Orders  No laboratory test(s) ordered today    Diagnostics: Spirometry:  Tracings reviewed. Her effort: {Blank single:19197::Good reproducible efforts.,It was hard to get consistent efforts and there is a question as to whether this reflects a maximal maneuver.,Poor effort, data can not be interpreted.} FVC: ***L FEV1: ***L, ***% predicted FEV1/FVC ratio: ***% Interpretation: {Blank single:19197::Spirometry consistent with mild obstructive disease,Spirometry consistent with moderate obstructive disease,Spirometry consistent with severe obstructive disease,Spirometry consistent with possible restrictive disease,Spirometry consistent with mixed obstructive and restrictive disease,Spirometry uninterpretable due to technique,Spirometry consistent with normal pattern,No overt abnormalities noted given today's efforts}.  Please see scanned spirometry results for details.  Skin Testing: {Blank single:19197::Select foods,Environmental allergy panel,Environmental allergy panel and select foods,Food allergy panel,None,Deferred due to recent antihistamines use}. *** Results discussed with patient/family.   Medication List:  Current Outpatient Medications  Medication Sig Dispense Refill  . albuterol  (PROAIR  HFA) 108 (90 Base) MCG/ACT inhaler Inhale 2 puffs into the lungs every 4 (four) hours as needed for wheezing or shortness of breath. 1 each 0  . budesonide -formoterol  (SYMBICORT ) 80-4.5 MCG/ACT inhaler Inhale 2 puffs into the lungs in the morning and at bedtime. 1 each 0  . cetirizine  (ZYRTEC  ALLERGY) 10 MG tablet Take 1 tablet (10 mg total) by mouth daily. 30 tablet 0  . diphenhydrAMINE  HCl (BENADRYL  ALLERGY PO) Take by mouth.    . NOREL AD 4-10-325 MG TABS Take 1 tablet by mouth 4 (four) times daily.    . Vitamin D, Ergocalciferol, (  DRISDOL) 1.25 MG (50000 UNIT) CAPS capsule Take 50,000  Units by mouth once a week.     No current facility-administered medications for this visit.   Allergies: No Known Allergies I reviewed her past medical history, social history, family history, and environmental history and no significant changes have been reported from her previous visit.  Review of Systems  Constitutional:  Negative for appetite change, chills, fever and unexpected weight change.  HENT:  Negative for congestion and rhinorrhea.   Eyes:  Negative for itching.  Respiratory:  Negative for cough, chest tightness, shortness of breath and wheezing.   Cardiovascular:  Negative for chest pain.  Gastrointestinal:  Negative for abdominal pain.  Genitourinary:  Negative for difficulty urinating.  Skin:  Negative for rash.  Allergic/Immunologic: Positive for environmental allergies.  Neurological:  Negative for headaches.   Objective: LMP 07/09/2023  There is no height or weight on file to calculate BMI. Physical Exam Vitals and nursing note reviewed.  Constitutional:      Appearance: Normal appearance. She is well-developed.  HENT:     Head: Normocephalic and atraumatic.     Right Ear: Tympanic membrane and external ear normal.     Left Ear: Tympanic membrane and external ear normal.     Nose: Nose normal.     Mouth/Throat:     Mouth: Mucous membranes are moist.     Pharynx: Oropharynx is clear.   Eyes:     Conjunctiva/sclera: Conjunctivae normal.    Cardiovascular:     Rate and Rhythm: Normal rate and regular rhythm.     Heart sounds: Normal heart sounds. No murmur heard.    No friction rub. No gallop.  Pulmonary:     Effort: Pulmonary effort is normal.     Breath sounds: Normal breath sounds. No wheezing, rhonchi or rales.   Musculoskeletal:     Cervical back: Neck supple.   Skin:    General: Skin is warm.     Findings: No rash.   Neurological:     Mental Status: She is alert and oriented to person, place, and time.   Psychiatric:        Behavior:  Behavior normal.  Previous notes and tests were reviewed. The plan was reviewed with the patient/family, and all questions/concerned were addressed.  It was my pleasure to see Jasmine [name] today and participate in her care. Please feel free to contact me with any questions or concerns.  Sincerely,  Eudelia Hero, DO Allergy & Immunology  [facility]  [facility] office: [phone] [facility] office: [phone]

## 2023-08-10 ENCOUNTER — Other Ambulatory Visit: Payer: Self-pay

## 2023-08-10 ENCOUNTER — Ambulatory Visit (INDEPENDENT_AMBULATORY_CARE_PROVIDER_SITE_OTHER): Payer: Self-pay | Admitting: Allergy

## 2023-08-10 ENCOUNTER — Encounter: Payer: Self-pay | Admitting: Allergy

## 2023-08-10 VITALS — BP 116/86 | HR 82 | Temp 98.6°F | Resp 19 | Ht 64.75 in | Wt 198.8 lb

## 2023-08-10 DIAGNOSIS — E739 Lactose intolerance, unspecified: Secondary | ICD-10-CM

## 2023-08-10 DIAGNOSIS — K219 Gastro-esophageal reflux disease without esophagitis: Secondary | ICD-10-CM

## 2023-08-10 DIAGNOSIS — R053 Chronic cough: Secondary | ICD-10-CM | POA: Diagnosis not present

## 2023-08-10 DIAGNOSIS — J3089 Other allergic rhinitis: Secondary | ICD-10-CM | POA: Diagnosis not present

## 2023-08-10 MED ORDER — AZELASTINE HCL 0.1 % NA SOLN: 1.0000 | Freq: Two times a day (BID) | NASAL | 5 refills | Status: AC | PRN

## 2023-08-10 NOTE — Patient Instructions (Addendum)
 Environmental allergies 2022 skin testing positive to grass, weed, trees, mold and mouse. Use over the counter antihistamines such as Zyrtec  (cetirizine ), Claritin (loratadine), Allegra (fexofenadine), or Xyzal (levocetirizine) daily as needed. May take twice a day during allergy flares. May switch antihistamines every few months. Use azelastine nasal spray 1-2 sprays per nostril twice a day as needed for runny nose/drainage. Use Flonase  (fluticasone ) nasal spray 1-2 sprays per nostril once a day as needed for nasal congestion.  Nasal saline spray (i.e., Simply Saline) or nasal saline lavage (i.e., NeilMed) is recommended as needed and prior to medicated nasal sprays.  Consider allergy injections for long term control if above medications do not help the symptoms - handout given.   Coughing/breathing Daily controller medication(s): start Breztri 2 puffs twice a day and rinse mouth after each use.  May use albuterol  rescue inhaler 2 puffs or nebulizer every 4 to 6 hours as needed for shortness of breath, chest tightness, coughing, and wheezing. May use albuterol  rescue inhaler 2 puffs 5 to 15 minutes prior to strenuous physical activities. Monitor frequency of use - if you need to use it more than twice per week on a consistent basis let us  know.  Breathing control goals:  Full participation in all desired activities (may need albuterol  before activity) Albuterol  use two times or less a week on average (not counting use with activity) Cough interfering with sleep two times or less a month Oral steroids no more than once a year No hospitalizations   Reflux Continue lifestyle and dietary modifications - handout given. May take famotidine (pepcid) 20mg  once a day as needed.   Possible lactose intolerance May use lactose free milk or take a lactaid pill right before consuming anything with dairy.  Follow up in 2 months or sooner if needed.   Reducing Pollen Exposure Pollen seasons: trees  (spring), grass (summer) and ragweed/weeds (fall). Keep windows closed in your home and car to lower pollen exposure.  Install air conditioning in the bedroom and throughout the house if possible.  Avoid going out in dry windy days - especially early morning. Pollen counts are highest between 5 - 10 AM and on dry, hot and windy days.  Save outside activities for late afternoon or after a heavy rain, when pollen levels are lower.  Avoid mowing of grass if you have grass pollen allergy. Be aware that pollen can also be transported indoors on people and pets.  Dry your clothes in an automatic dryer rather than hanging them outside where they might collect pollen.  Rinse hair and eyes before bedtime.  Mold Control Mold and fungi can grow on a variety of surfaces provided certain temperature and moisture conditions exist.  Outdoor molds grow on plants, decaying vegetation and soil. The major outdoor mold, Alternaria and Cladosporium, are found in very high numbers during hot and dry conditions. Generally, a late summer - fall peak is seen for common outdoor fungal spores. Rain will temporarily lower outdoor mold spore count, but counts rise rapidly when the rainy period ends. The most important indoor molds are Aspergillus and Penicillium. Dark, humid and poorly ventilated basements are ideal sites for mold growth. The next most common sites of mold growth are the bathroom and the kitchen. Outdoor (Seasonal) Mold Control Use air conditioning and keep windows closed. Avoid exposure to decaying vegetation. Avoid leaf raking. Avoid grain handling. Consider wearing a face mask if working in moldy areas.  Indoor (Perennial) Mold Control  Maintain humidity below 50%. Get rid  of mold growth on hard surfaces with water , detergent and, if necessary, 5% bleach (do not mix with other cleaners). Then dry the area completely. If mold covers an area more than 10 square feet, consider hiring an indoor  environmental professional. For clothing, washing with soap and water  is best. If moldy items cannot be cleaned and dried, throw them away. Remove sources e.g. contaminated carpets. Repair and seal leaking roofs or pipes. Using dehumidifiers in damp basements may be helpful, but empty the water  and clean units regularly to prevent mildew from forming. All rooms, especially basements, bathrooms and kitchens, require ventilation and cleaning to deter mold and mildew growth. Avoid carpeting on concrete or damp floors, and storing items in damp areas.

## 2023-10-05 ENCOUNTER — Ambulatory Visit: Admitting: Allergy

## 2023-10-05 DIAGNOSIS — J309 Allergic rhinitis, unspecified: Secondary | ICD-10-CM
